# Patient Record
Sex: Female | Born: 1940 | Race: Black or African American | Hispanic: No | Marital: Married | Smoking: Former smoker
Health system: Southern US, Community
[De-identification: ages and names within clinical notes are randomized; demographics above are authoritative.]

## PROBLEM LIST (undated history)

## (undated) DIAGNOSIS — R319 Hematuria, unspecified: Secondary | ICD-10-CM

## (undated) DIAGNOSIS — F32A Depression, unspecified: Secondary | ICD-10-CM

## (undated) DIAGNOSIS — K8689 Other specified diseases of pancreas: Secondary | ICD-10-CM

## (undated) DIAGNOSIS — G309 Alzheimer's disease, unspecified: Secondary | ICD-10-CM

## (undated) DIAGNOSIS — F419 Anxiety disorder, unspecified: Secondary | ICD-10-CM

## (undated) DIAGNOSIS — F329 Major depressive disorder, single episode, unspecified: Secondary | ICD-10-CM

## (undated) DIAGNOSIS — M353 Polymyalgia rheumatica: Secondary | ICD-10-CM

## (undated) DIAGNOSIS — D649 Anemia, unspecified: Secondary | ICD-10-CM

## (undated) DIAGNOSIS — G459 Transient cerebral ischemic attack, unspecified: Secondary | ICD-10-CM

## (undated) DIAGNOSIS — M797 Fibromyalgia: Secondary | ICD-10-CM

## (undated) DIAGNOSIS — R5382 Chronic fatigue, unspecified: Secondary | ICD-10-CM

## (undated) DIAGNOSIS — G9332 Myalgic encephalomyelitis/chronic fatigue syndrome: Secondary | ICD-10-CM

## (undated) DIAGNOSIS — F0280 Dementia in other diseases classified elsewhere without behavioral disturbance: Secondary | ICD-10-CM

## (undated) DIAGNOSIS — N390 Urinary tract infection, site not specified: Secondary | ICD-10-CM

## (undated) DIAGNOSIS — K599 Functional intestinal disorder, unspecified: Secondary | ICD-10-CM

## (undated) HISTORY — DX: Polymyalgia rheumatica: M35.3

## (undated) HISTORY — PX: OTHER SURGICAL HISTORY: SHX169

## (undated) HISTORY — DX: Dementia in other diseases classified elsewhere, unspecified severity, without behavioral disturbance, psychotic disturbance, mood disturbance, and anxiety: F02.80

## (undated) HISTORY — DX: Other specified diseases of pancreas: K86.89

## (undated) HISTORY — DX: Anxiety disorder, unspecified: F41.9

## (undated) HISTORY — DX: Alzheimer's disease, unspecified: G30.9

## (undated) HISTORY — DX: Transient cerebral ischemic attack, unspecified: G45.9

## (undated) HISTORY — DX: Anemia, unspecified: D64.9

## (undated) HISTORY — DX: Myalgic encephalomyelitis/chronic fatigue syndrome: G93.32

## (undated) HISTORY — DX: Fibromyalgia: M79.7

## (undated) HISTORY — DX: Functional intestinal disorder, unspecified: K59.9

## (undated) HISTORY — DX: Chronic fatigue, unspecified: R53.82

## (undated) HISTORY — DX: Depression, unspecified: F32.A

## (undated) HISTORY — DX: Urinary tract infection, site not specified: N39.0

## (undated) HISTORY — DX: Hematuria, unspecified: R31.9

## (undated) HISTORY — DX: Major depressive disorder, single episode, unspecified: F32.9

---

## 1997-03-20 ENCOUNTER — Ambulatory Visit (HOSPITAL_COMMUNITY): Admission: RE | Admit: 1997-03-20 | Discharge: 1997-03-20 | Payer: Self-pay | Admitting: Internal Medicine

## 1997-06-03 ENCOUNTER — Other Ambulatory Visit: Admission: RE | Admit: 1997-06-03 | Discharge: 1997-06-03 | Payer: Self-pay | Admitting: Internal Medicine

## 1997-08-10 ENCOUNTER — Ambulatory Visit (HOSPITAL_COMMUNITY): Admission: RE | Admit: 1997-08-10 | Discharge: 1997-08-10 | Payer: Self-pay | Admitting: Internal Medicine

## 1997-10-15 ENCOUNTER — Ambulatory Visit (HOSPITAL_COMMUNITY): Admission: RE | Admit: 1997-10-15 | Discharge: 1997-10-15 | Payer: Self-pay | Admitting: Internal Medicine

## 1997-10-21 ENCOUNTER — Ambulatory Visit (HOSPITAL_COMMUNITY): Admission: RE | Admit: 1997-10-21 | Discharge: 1997-10-21 | Payer: Self-pay | Admitting: Internal Medicine

## 1997-10-21 ENCOUNTER — Encounter: Payer: Self-pay | Admitting: Internal Medicine

## 1998-03-17 ENCOUNTER — Emergency Department (HOSPITAL_COMMUNITY): Admission: EM | Admit: 1998-03-17 | Discharge: 1998-03-17 | Payer: Self-pay | Admitting: Emergency Medicine

## 1998-10-05 ENCOUNTER — Other Ambulatory Visit: Admission: RE | Admit: 1998-10-05 | Discharge: 1998-10-05 | Payer: Self-pay | Admitting: Internal Medicine

## 1998-10-20 ENCOUNTER — Ambulatory Visit (HOSPITAL_COMMUNITY): Admission: RE | Admit: 1998-10-20 | Discharge: 1998-10-20 | Payer: Self-pay | Admitting: Internal Medicine

## 1998-10-20 ENCOUNTER — Encounter: Payer: Self-pay | Admitting: Internal Medicine

## 1998-11-16 ENCOUNTER — Encounter: Admission: RE | Admit: 1998-11-16 | Discharge: 1998-11-16 | Payer: Self-pay | Admitting: Internal Medicine

## 1998-11-16 ENCOUNTER — Encounter: Payer: Self-pay | Admitting: Internal Medicine

## 2000-04-20 ENCOUNTER — Ambulatory Visit (HOSPITAL_COMMUNITY): Admission: RE | Admit: 2000-04-20 | Discharge: 2000-04-20 | Payer: Self-pay | Admitting: Neurology

## 2000-05-08 ENCOUNTER — Encounter: Admission: RE | Admit: 2000-05-08 | Discharge: 2000-05-08 | Payer: Self-pay | Admitting: Internal Medicine

## 2000-05-08 ENCOUNTER — Encounter: Payer: Self-pay | Admitting: Internal Medicine

## 2000-06-01 ENCOUNTER — Ambulatory Visit (HOSPITAL_COMMUNITY): Admission: RE | Admit: 2000-06-01 | Discharge: 2000-06-01 | Payer: Self-pay | Admitting: Neurology

## 2000-06-01 ENCOUNTER — Encounter: Payer: Self-pay | Admitting: Neurology

## 2001-02-27 ENCOUNTER — Ambulatory Visit (HOSPITAL_COMMUNITY): Admission: RE | Admit: 2001-02-27 | Discharge: 2001-02-27 | Payer: Self-pay | Admitting: Gastroenterology

## 2001-02-27 ENCOUNTER — Encounter (INDEPENDENT_AMBULATORY_CARE_PROVIDER_SITE_OTHER): Payer: Self-pay | Admitting: Specialist

## 2001-05-15 ENCOUNTER — Encounter: Payer: Self-pay | Admitting: Internal Medicine

## 2001-05-15 ENCOUNTER — Encounter: Admission: RE | Admit: 2001-05-15 | Discharge: 2001-05-15 | Payer: Self-pay | Admitting: Internal Medicine

## 2002-08-26 ENCOUNTER — Encounter: Payer: Self-pay | Admitting: Internal Medicine

## 2002-08-26 ENCOUNTER — Encounter: Admission: RE | Admit: 2002-08-26 | Discharge: 2002-08-26 | Payer: Self-pay | Admitting: Internal Medicine

## 2003-09-04 ENCOUNTER — Encounter: Admission: RE | Admit: 2003-09-04 | Discharge: 2003-09-04 | Payer: Self-pay | Admitting: Internal Medicine

## 2003-10-02 ENCOUNTER — Encounter (HOSPITAL_COMMUNITY): Admission: RE | Admit: 2003-10-02 | Discharge: 2003-12-31 | Payer: Self-pay | Admitting: Cardiology

## 2004-09-14 ENCOUNTER — Encounter: Admission: RE | Admit: 2004-09-14 | Discharge: 2004-09-14 | Payer: Self-pay | Admitting: Internal Medicine

## 2004-12-02 ENCOUNTER — Encounter (INDEPENDENT_AMBULATORY_CARE_PROVIDER_SITE_OTHER): Payer: Self-pay | Admitting: Specialist

## 2004-12-02 ENCOUNTER — Ambulatory Visit (HOSPITAL_COMMUNITY): Admission: RE | Admit: 2004-12-02 | Discharge: 2004-12-02 | Payer: Self-pay | Admitting: General Surgery

## 2005-09-02 ENCOUNTER — Emergency Department (HOSPITAL_COMMUNITY): Admission: EM | Admit: 2005-09-02 | Discharge: 2005-09-02 | Payer: Self-pay | Admitting: Emergency Medicine

## 2005-09-22 ENCOUNTER — Encounter: Admission: RE | Admit: 2005-09-22 | Discharge: 2005-09-22 | Payer: Self-pay | Admitting: Internal Medicine

## 2006-03-12 ENCOUNTER — Ambulatory Visit (HOSPITAL_BASED_OUTPATIENT_CLINIC_OR_DEPARTMENT_OTHER): Admission: RE | Admit: 2006-03-12 | Discharge: 2006-03-12 | Payer: Self-pay | Admitting: General Surgery

## 2006-03-12 ENCOUNTER — Encounter (INDEPENDENT_AMBULATORY_CARE_PROVIDER_SITE_OTHER): Payer: Self-pay | Admitting: *Deleted

## 2006-06-18 ENCOUNTER — Ambulatory Visit: Payer: Self-pay | Admitting: Psychology

## 2006-09-24 ENCOUNTER — Encounter: Admission: RE | Admit: 2006-09-24 | Discharge: 2006-09-24 | Payer: Self-pay | Admitting: Internal Medicine

## 2007-02-15 ENCOUNTER — Encounter: Admission: RE | Admit: 2007-02-15 | Discharge: 2007-02-15 | Payer: Self-pay | Admitting: Internal Medicine

## 2007-08-20 ENCOUNTER — Emergency Department (HOSPITAL_COMMUNITY): Admission: EM | Admit: 2007-08-20 | Discharge: 2007-08-20 | Payer: Self-pay | Admitting: Emergency Medicine

## 2007-08-28 ENCOUNTER — Encounter: Admission: RE | Admit: 2007-08-28 | Discharge: 2007-08-28 | Payer: Self-pay | Admitting: Neurology

## 2007-09-25 ENCOUNTER — Encounter: Admission: RE | Admit: 2007-09-25 | Discharge: 2007-09-25 | Payer: Self-pay | Admitting: Internal Medicine

## 2007-12-30 ENCOUNTER — Encounter: Admission: RE | Admit: 2007-12-30 | Discharge: 2007-12-30 | Payer: Self-pay | Admitting: Internal Medicine

## 2008-05-04 ENCOUNTER — Emergency Department (HOSPITAL_COMMUNITY): Admission: EM | Admit: 2008-05-04 | Discharge: 2008-05-04 | Payer: Self-pay | Admitting: Emergency Medicine

## 2008-12-17 ENCOUNTER — Inpatient Hospital Stay (HOSPITAL_COMMUNITY): Admission: AD | Admit: 2008-12-17 | Discharge: 2008-12-23 | Payer: Self-pay | Admitting: Internal Medicine

## 2009-01-09 ENCOUNTER — Emergency Department (HOSPITAL_COMMUNITY): Admission: EM | Admit: 2009-01-09 | Discharge: 2009-01-10 | Payer: Self-pay | Admitting: Emergency Medicine

## 2009-01-11 ENCOUNTER — Inpatient Hospital Stay (HOSPITAL_COMMUNITY): Admission: AD | Admit: 2009-01-11 | Discharge: 2009-01-19 | Payer: Self-pay | Admitting: Internal Medicine

## 2009-01-29 ENCOUNTER — Encounter: Admission: RE | Admit: 2009-01-29 | Discharge: 2009-01-29 | Payer: Self-pay | Admitting: Internal Medicine

## 2009-06-24 LAB — HM COLONOSCOPY

## 2009-09-22 ENCOUNTER — Ambulatory Visit (HOSPITAL_COMMUNITY): Admission: RE | Admit: 2009-09-22 | Discharge: 2009-09-22 | Payer: Self-pay | Admitting: Orthopedic Surgery

## 2010-03-17 LAB — BASIC METABOLIC PANEL
CO2: 26 mEq/L (ref 19–32)
Chloride: 109 mEq/L (ref 96–112)
Creatinine, Ser: 0.71 mg/dL (ref 0.4–1.2)
GFR calc Af Amer: >60 mL/min (ref >60)

## 2010-03-17 LAB — CBC
MCH: 30.9 pg (ref 26.0–34.0)
MCV: 92.4 fL (ref 78.0–100.0)
Platelets: 111 10*3/uL — ABNORMAL LOW (ref 150–400)
RBC: 3.53 MIL/uL — ABNORMAL LOW (ref 3.87–5.11)

## 2010-03-20 LAB — COMPREHENSIVE METABOLIC PANEL
ALT: 27 U/L (ref 0–35)
ALT: 34 U/L (ref 0–35)
AST: 25 U/L (ref 0–37)
AST: 33 U/L (ref 0–37)
Albumin: 2.9 g/dL — ABNORMAL LOW (ref 3.5–5.2)
Albumin: 3.3 g/dL — ABNORMAL LOW (ref 3.5–5.2)
Albumin: 3.8 g/dL (ref 3.5–5.2)
Alkaline Phosphatase: 36 U/L — ABNORMAL LOW (ref 39–117)
Alkaline Phosphatase: 37 U/L — ABNORMAL LOW (ref 39–117)
Alkaline Phosphatase: 38 U/L — ABNORMAL LOW (ref 39–117)
Alkaline Phosphatase: 41 U/L (ref 39–117)
Alkaline Phosphatase: 42 U/L (ref 39–117)
BUN: 14 mg/dL (ref 6–23)
BUN: 15 mg/dL (ref 6–23)
BUN: 17 mg/dL (ref 6–23)
CO2: 28 mEq/L (ref 19–32)
CO2: 29 mEq/L (ref 19–32)
Calcium: 8.7 mg/dL (ref 8.4–10.5)
Calcium: 8.8 mg/dL (ref 8.4–10.5)
Chloride: 104 mEq/L (ref 96–112)
Chloride: 105 mEq/L (ref 96–112)
Chloride: 105 mEq/L (ref 96–112)
Chloride: 105 mEq/L (ref 96–112)
Creatinine, Ser: 0.62 mg/dL (ref 0.4–1.2)
Creatinine, Ser: 0.71 mg/dL (ref 0.4–1.2)
Creatinine, Ser: 0.73 mg/dL (ref 0.4–1.2)
GFR calc Af Amer: >60 mL/min (ref >60)
GFR calc Af Amer: >60 mL/min (ref >60)
GFR calc non Af Amer: >60 mL/min (ref >60)
GFR calc non Af Amer: >60 mL/min (ref >60)
Glucose, Bld: 109 mg/dL — ABNORMAL HIGH (ref 70–99)
Glucose, Bld: 115 mg/dL — ABNORMAL HIGH (ref 70–99)
Glucose, Bld: 140 mg/dL — ABNORMAL HIGH (ref 70–99)
Glucose, Bld: 146 mg/dL — ABNORMAL HIGH (ref 70–99)
Potassium: 3.7 mEq/L (ref 3.5–5.1)
Potassium: 3.9 mEq/L (ref 3.5–5.1)
Potassium: 4.2 mEq/L (ref 3.5–5.1)
Potassium: 4.2 mEq/L (ref 3.5–5.1)
Sodium: 139 mEq/L (ref 135–145)
Sodium: 140 mEq/L (ref 135–145)
Sodium: 141 mEq/L (ref 135–145)
Sodium: 141 mEq/L (ref 135–145)
Total Bilirubin: 0.3 mg/dL (ref 0.3–1.2)
Total Bilirubin: 0.5 mg/dL (ref 0.3–1.2)
Total Bilirubin: 0.6 mg/dL (ref 0.3–1.2)
Total Bilirubin: 0.7 mg/dL (ref 0.3–1.2)
Total Protein: 5.7 g/dL — ABNORMAL LOW (ref 6.0–8.3)
Total Protein: 6 g/dL (ref 6.0–8.3)
Total Protein: 6.9 g/dL (ref 6.0–8.3)

## 2010-03-20 LAB — CBC
HCT: 32.8 % — ABNORMAL LOW (ref 36.0–46.0)
HCT: 34.7 % — ABNORMAL LOW (ref 36.0–46.0)
Hemoglobin: 11 g/dL — ABNORMAL LOW (ref 12.0–15.0)
Hemoglobin: 11.5 g/dL — ABNORMAL LOW (ref 12.0–15.0)
MCHC: 33.7 g/dL (ref 30.0–36.0)
MCV: 93 fL (ref 78.0–100.0)
Platelets: 141 10*3/uL — ABNORMAL LOW (ref 150–400)
RBC: 3.52 MIL/uL — ABNORMAL LOW (ref 3.87–5.11)
RBC: 3.71 MIL/uL — ABNORMAL LOW (ref 3.87–5.11)
RDW: 13.1 % (ref 11.5–15.5)
RDW: 13.4 % (ref 11.5–15.5)
WBC: 16.2 K/uL — ABNORMAL HIGH (ref 4.0–10.5)

## 2010-03-20 LAB — URINALYSIS, ROUTINE W REFLEX MICROSCOPIC
Bilirubin Urine: NEGATIVE
Nitrite: NEGATIVE
Specific Gravity, Urine: 1.024 (ref 1.005–1.030)
pH: 5.5 (ref 5.0–8.0)

## 2010-03-20 LAB — URINE CULTURE: Colony Count: 60000

## 2010-03-20 LAB — AMYLASE
Amylase: 372 U/L — ABNORMAL HIGH (ref 0–105)
Amylase: 80 U/L (ref 0–105)
Amylase: 95 U/L (ref 0–105)

## 2010-03-20 LAB — COMPREHENSIVE METABOLIC PANEL WITH GFR
ALT: 31 U/L (ref 0–35)
BUN: 25 mg/dL — ABNORMAL HIGH (ref 6–23)
CO2: 27 meq/L (ref 19–32)
Calcium: 9.7 mg/dL (ref 8.4–10.5)
Creatinine, Ser: 0.74 mg/dL (ref 0.4–1.2)
GFR calc non Af Amer: >60 mL/min (ref >60)
Glucose, Bld: 200 mg/dL — ABNORMAL HIGH (ref 70–99)
Total Protein: 7.1 g/dL (ref 6.0–8.3)

## 2010-03-20 LAB — DIFFERENTIAL
Basophils Absolute: 0 K/uL (ref 0.0–0.1)
Basophils Relative: 0 % (ref 0–1)
Eosinophils Absolute: 0 10*3/uL (ref 0.0–0.7)
Eosinophils Relative: 0 % (ref 0–5)
Lymphocytes Relative: 3 % — ABNORMAL LOW (ref 12–46)
Lymphs Abs: 0.5 K/uL — ABNORMAL LOW (ref 0.7–4.0)
Monocytes Absolute: 1 K/uL (ref 0.1–1.0)
Monocytes Relative: 6 % (ref 3–12)
Neutro Abs: 14.7 K/uL — ABNORMAL HIGH (ref 1.7–7.7)
Neutrophils Relative %: 91 % — ABNORMAL HIGH (ref 43–77)

## 2010-03-20 LAB — POCT CARDIAC MARKERS
CKMB, poc: <1 ng/mL — ABNORMAL LOW (ref 1.0–8.0)
Myoglobin, poc: 40 ng/mL (ref 12–200)
Troponin i, poc: <0.05 ng/mL (ref 0.00–0.09)

## 2010-03-20 LAB — URINE MICROSCOPIC-ADD ON

## 2010-03-20 LAB — AMMONIA: Ammonia: 15 umol/L (ref 11–35)

## 2010-03-20 LAB — GLUCOSE, CAPILLARY
Glucose-Capillary: 162 mg/dL — ABNORMAL HIGH (ref 70–99)
Glucose-Capillary: 211 mg/dL — ABNORMAL HIGH (ref 70–99)

## 2010-03-20 LAB — LIPASE, BLOOD
Lipase: 29 U/L (ref 11–59)
Lipase: 32 U/L (ref 11–59)
Lipase: 331 U/L — ABNORMAL HIGH (ref 11–59)

## 2010-03-21 LAB — COMPREHENSIVE METABOLIC PANEL
AST: 64 U/L — ABNORMAL HIGH (ref 0–37)
Albumin: 3.3 g/dL — ABNORMAL LOW (ref 3.5–5.2)
Alkaline Phosphatase: 37 U/L — ABNORMAL LOW (ref 39–117)
Chloride: 105 mEq/L (ref 96–112)
GFR calc Af Amer: >60 mL/min (ref >60)
Potassium: 4.2 mEq/L (ref 3.5–5.1)
Total Bilirubin: 0.3 mg/dL (ref 0.3–1.2)

## 2010-03-21 LAB — GLUCOSE, CAPILLARY

## 2010-04-04 LAB — GLUCOSE, CAPILLARY
Glucose-Capillary: 122 mg/dL — ABNORMAL HIGH (ref 70–99)
Glucose-Capillary: 142 mg/dL — ABNORMAL HIGH (ref 70–99)
Glucose-Capillary: 150 mg/dL — ABNORMAL HIGH (ref 70–99)
Glucose-Capillary: 156 mg/dL — ABNORMAL HIGH (ref 70–99)
Glucose-Capillary: 183 mg/dL — ABNORMAL HIGH (ref 70–99)
Glucose-Capillary: 196 mg/dL — ABNORMAL HIGH (ref 70–99)
Glucose-Capillary: 197 mg/dL — ABNORMAL HIGH (ref 70–99)
Glucose-Capillary: 265 mg/dL — ABNORMAL HIGH (ref 70–99)
Glucose-Capillary: 71 mg/dL (ref 70–99)
Glucose-Capillary: 73 mg/dL (ref 70–99)
Glucose-Capillary: 91 mg/dL (ref 70–99)
Glucose-Capillary: 93 mg/dL (ref 70–99)
Glucose-Capillary: 94 mg/dL (ref 70–99)
Glucose-Capillary: 96 mg/dL (ref 70–99)

## 2010-04-04 LAB — CBC
MCHC: 33.5 g/dL (ref 30.0–36.0)
MCV: 93.6 fL (ref 78.0–100.0)
MCV: 94.6 fL (ref 78.0–100.0)
Platelets: 152 10*3/uL (ref 150–400)
Platelets: 175 10*3/uL (ref 150–400)
RBC: 3.35 MIL/uL — ABNORMAL LOW (ref 3.87–5.11)
RBC: 3.71 MIL/uL — ABNORMAL LOW (ref 3.87–5.11)
WBC: 9 10*3/uL (ref 4.0–10.5)

## 2010-04-04 LAB — COMPREHENSIVE METABOLIC PANEL
ALT: 16 U/L (ref 0–35)
ALT: 34 U/L (ref 0–35)
AST: 21 U/L (ref 0–37)
AST: 31 U/L (ref 0–37)
Albumin: 3.1 g/dL — ABNORMAL LOW (ref 3.5–5.2)
Albumin: 3.8 g/dL (ref 3.5–5.2)
Alkaline Phosphatase: 38 U/L — ABNORMAL LOW (ref 39–117)
BUN: 23 mg/dL (ref 6–23)
CO2: 26 mEq/L (ref 19–32)
CO2: 29 mEq/L (ref 19–32)
Chloride: 104 mEq/L (ref 96–112)
Chloride: 106 mEq/L (ref 96–112)
Creatinine, Ser: 0.68 mg/dL (ref 0.4–1.2)
Creatinine, Ser: 0.86 mg/dL (ref 0.4–1.2)
GFR calc Af Amer: >60 mL/min (ref >60)
GFR calc Af Amer: >60 mL/min (ref >60)
GFR calc non Af Amer: >60 mL/min (ref >60)
Glucose, Bld: 121 mg/dL — ABNORMAL HIGH (ref 70–99)
Glucose, Bld: 157 mg/dL — ABNORMAL HIGH (ref 70–99)
Potassium: 3.8 mEq/L (ref 3.5–5.1)
Sodium: 137 mEq/L (ref 135–145)
Sodium: 139 mEq/L (ref 135–145)
Total Bilirubin: 0.3 mg/dL (ref 0.3–1.2)
Total Bilirubin: 0.6 mg/dL (ref 0.3–1.2)
Total Protein: 5.6 g/dL — ABNORMAL LOW (ref 6.0–8.3)

## 2010-04-04 LAB — DIFFERENTIAL
Basophils Absolute: 0 10*3/uL (ref 0.0–0.1)
Lymphocytes Relative: 10 % — ABNORMAL LOW (ref 12–46)
Monocytes Absolute: 0.2 10*3/uL (ref 0.1–1.0)
Neutro Abs: 6.9 10*3/uL (ref 1.7–7.7)
Neutrophils Relative %: 87 % — ABNORMAL HIGH (ref 43–77)

## 2010-04-04 LAB — CEA: CEA: 1 ng/mL (ref 0.0–5.0)

## 2010-04-04 LAB — SEDIMENTATION RATE: Sed Rate: 41 mm/hr — ABNORMAL HIGH (ref 0–22)

## 2010-04-04 LAB — URINALYSIS, ROUTINE W REFLEX MICROSCOPIC
Glucose, UA: NEGATIVE mg/dL
Ketones, ur: NEGATIVE mg/dL
Protein, ur: NEGATIVE mg/dL

## 2010-04-04 LAB — C-REACTIVE PROTEIN: CRP: 0 mg/dL — ABNORMAL LOW (ref <0.6)

## 2010-04-04 LAB — PREALBUMIN: Prealbumin: 44 mg/dL (ref 18.0–45.0)

## 2010-04-04 LAB — URINE CULTURE
Culture: NO GROWTH
Special Requests: NEGATIVE

## 2010-04-04 LAB — CANCER ANTIGEN 19-9: CA 19-9: 284.7 U/mL — ABNORMAL HIGH (ref <35.0)

## 2010-04-07 ENCOUNTER — Other Ambulatory Visit: Payer: Self-pay | Admitting: Internal Medicine

## 2010-04-07 DIAGNOSIS — Z1231 Encounter for screening mammogram for malignant neoplasm of breast: Secondary | ICD-10-CM

## 2010-04-11 ENCOUNTER — Ambulatory Visit: Payer: Self-pay

## 2010-04-12 ENCOUNTER — Other Ambulatory Visit: Payer: Self-pay | Admitting: Internal Medicine

## 2010-04-12 ENCOUNTER — Ambulatory Visit
Admission: RE | Admit: 2010-04-12 | Discharge: 2010-04-12 | Disposition: A | Discharge status: Home / Self Care | Payer: Medicare Other | Source: Ambulatory Visit | Attending: Internal Medicine | Admitting: Internal Medicine

## 2010-04-12 DIAGNOSIS — Z1231 Encounter for screening mammogram for malignant neoplasm of breast: Secondary | ICD-10-CM

## 2010-04-12 LAB — CBC
HCT: 36.3 % (ref 36.0–46.0)
Hemoglobin: 12.2 g/dL (ref 12.0–15.0)
MCHC: 33.6 g/dL (ref 30.0–36.0)
MCV: 93.9 fL (ref 78.0–100.0)
RDW: 13.3 % (ref 11.5–15.5)

## 2010-04-12 LAB — URINALYSIS, ROUTINE W REFLEX MICROSCOPIC
Bilirubin Urine: NEGATIVE
Ketones, ur: 15 mg/dL — AB
Nitrite: NEGATIVE
Protein, ur: NEGATIVE mg/dL
pH: 5.5 (ref 5.0–8.0)

## 2010-04-12 LAB — URINE MICROSCOPIC-ADD ON

## 2010-04-12 LAB — DIFFERENTIAL
Basophils Absolute: 0 10*3/uL (ref 0.0–0.1)
Eosinophils Absolute: 0 10*3/uL (ref 0.0–0.7)
Eosinophils Relative: 0 % (ref 0–5)
Lymphocytes Relative: 8 % — ABNORMAL LOW (ref 12–46)
Monocytes Absolute: 0.2 10*3/uL (ref 0.1–1.0)

## 2010-04-12 LAB — POCT I-STAT, CHEM 8
Chloride: 106 mEq/L (ref 96–112)
Glucose, Bld: 188 mg/dL — ABNORMAL HIGH (ref 70–99)
HCT: 37 % (ref 36.0–46.0)
Potassium: 3.9 mEq/L (ref 3.5–5.1)

## 2010-04-12 LAB — HM MAMMOGRAPHY

## 2010-04-12 LAB — GLUCOSE, CAPILLARY: Glucose-Capillary: 190 mg/dL — ABNORMAL HIGH (ref 70–99)

## 2010-04-15 ENCOUNTER — Encounter: Payer: Self-pay | Admitting: Internal Medicine

## 2010-04-26 ENCOUNTER — Other Ambulatory Visit: Payer: Self-pay | Admitting: Internal Medicine

## 2010-04-26 ENCOUNTER — Ambulatory Visit
Admission: RE | Admit: 2010-04-26 | Discharge: 2010-04-26 | Disposition: A | Discharge status: Home / Self Care | Payer: Medicare Other | Source: Ambulatory Visit | Attending: Internal Medicine | Admitting: Internal Medicine

## 2010-05-20 NOTE — Procedures (Signed)
[facility]  Patient:    Jaclyn Hernandez, Jaclyn Hernandez Visit Number: [id number] MRN: [id number]          Service Type: END Location: ENDO Attending Physician:  Rich Brave Dictated by:   Florencia Reasons, M.D. Proc. Date: 02/27/01 Admit Date:  02/27/2001   CC:         Eric L. August Saucer, M.D.   Procedure Report  PROCEDURE:  Colonoscopy with biopsies.  INDICATIONS FOR PROCEDURE:  A 70 year old female with chronic intermittent diarrhea (doing better recently) and need for colon cancer screening.  FINDINGS:  Normal exam to the cecum.  DESCRIPTION OF PROCEDURE:  The nature, purpose and risk of the procedure had been discussed with the patient who provided written consent. Sedation was fentanyl 50 mcg and Versed 6 mg IV without arrhythmias or desaturation. The Olympus pediatric standard extended length video colonoscope was advanced to the cecum with a little bit of external abdominal compression to help control looping and with slight difficulty getting around some angulated turns in the sigmoid region. The cecum was identified by visualization of the appendiceal orifice, the ileocecal valve and transillumination of the right lower quadrant. Pullback was then performed. The quality of the prep was excellent and it was felt that all areas were well seen.  This was a completely normal exam. No polyps, cancer, colitis, vascular malformations or diverticulosis were noted. Retroflexion was not accomplished in the rectum due to the anatomy but antegrade viewing disclosed no distal rectal lesions. Random mucosal biopsies were obtained along the length of the colon.  The patient tolerated the procedure well and there were no apparent complications.  IMPRESSION:   Normal colonoscopy. No endoscopically-evident source of diarrhea identified.  PLAN:  Await pathology on biopsies. Consider screening sigmoidoscopy in five years. Dictated by:   Florencia Reasons,  M.D. Attending Physician:  Rich Brave DD:  02/27/01 TD:  02/27/01 Job: [id number]

## 2010-05-20 NOTE — Op Note (Signed)
[name], [name]               ACCOUNT NO.:  [id number]   MEDICAL RECORD NO.:  [id number]          PATIENT TYPE:  AMB   LOCATION:  SDS                          FACILITY:  [facility]   PHYSICIAN:  Leonie Man, M.D.   DATE OF BIRTH:  Aug 04, 1940   DATE OF PROCEDURE:  12/02/2004  DATE OF DISCHARGE:                                 OPERATIVE REPORT   PREOP DIAGNOSIS:  Follicular cysts left labia majora   POSTOPERATIVE DIAGNOSIS:  Follicular cysts left labia majora, pathology  pending.   PROCEDURE:  Excision lesion left labia majora under local anesthesia.   NOTE:  [name] is a 70 year old lady with a labial cyst which she wishes  to have removed. She comes to the operating room, now, after the risks and  potential benefits of surgery have been discussed. All questions answered  and consent obtained.   With the patient positioned supinely and the left leg extended laterally.  The region of the cyst over the labia majora on the left was then prepped  and then draped into a sterile operative field.  The region around the cyst  is infiltrated with 1% lidocaine with epinephrine. An elliptical incision is  made around the cyst deepened through skin and subcutaneous tissues to  excise the entire skin and lesion. This was removed in its entirety and  forwarded for pathologic evaluation. Hemostasis was secured with some mild  pressure on the incision; and then the incision was closed in two layers  using interrupted 3-0 Vicryl sutures in the deep layers; and a running 4-0  Vicryl suture placed subcuticularly. The wound was then reinforced with  Steri-Strips and the patient removed from the operating room to the recovery  room in stable condition. She tolerated this procedure well.      Leonie Man, M.D.  Electronically Signed     PB/MEDQ  D:  12/02/2004  T:  12/02/2004  Job:  [id number]

## 2010-05-20 NOTE — Op Note (Signed)
[place]  Patient:    Jaclyn Hernandez, Jaclyn Hernandez                        MRN: [id number] Proc. Date: 04/20/00 Adm. Date:  [id number] Attending:  Erich Montane                           Operative Report  INDICATIONS:  The patient has a history of memory loss.  PROCEDURE: The patient was prepped and draped in the left lateral decubitus position.  The L3-L4 interspace was entered without difficulty.  The opening pressure was 155 mm of water.  Clear, colorless CSF was obtained, approximately 11 cc, and sent for studies including VDRL, cryptococcal antigens, IgG, oligoclonal IgG, protein, glucose, cell count, differential, ______ beta amyloid 42 protein.  The patient tolerated the procedure well. DD:  04/20/00 TD:  04/21/00 Job: [id number]

## 2010-07-11 ENCOUNTER — Emergency Department (HOSPITAL_COMMUNITY)
Admission: EM | Admit: 2010-07-11 | Discharge: 2010-07-11 | Disposition: A | Discharge status: Home / Self Care | Payer: Medicare Other | Attending: Emergency Medicine | Admitting: Emergency Medicine

## 2010-07-11 DIAGNOSIS — R5381 Other malaise: Secondary | ICD-10-CM | POA: Insufficient documentation

## 2010-07-11 DIAGNOSIS — R42 Dizziness and giddiness: Secondary | ICD-10-CM | POA: Insufficient documentation

## 2010-07-11 DIAGNOSIS — K5289 Other specified noninfective gastroenteritis and colitis: Secondary | ICD-10-CM | POA: Insufficient documentation

## 2010-07-11 DIAGNOSIS — E119 Type 2 diabetes mellitus without complications: Secondary | ICD-10-CM | POA: Insufficient documentation

## 2010-07-11 LAB — COMPREHENSIVE METABOLIC PANEL
AST: 23 U/L (ref 0–37)
BUN: 21 mg/dL (ref 6–23)
CO2: 28 mEq/L (ref 19–32)
Chloride: 100 mEq/L (ref 96–112)
Creatinine, Ser: 0.72 mg/dL (ref 0.50–1.10)
GFR calc non Af Amer: >60 mL/min (ref >60)
Glucose, Bld: 140 mg/dL — ABNORMAL HIGH (ref 70–99)
Total Bilirubin: 0.4 mg/dL (ref 0.3–1.2)

## 2010-07-11 LAB — CBC
HCT: 35.7 % — ABNORMAL LOW (ref 36.0–46.0)
Hemoglobin: 11.7 g/dL — ABNORMAL LOW (ref 12.0–15.0)
MCV: 92.7 fL (ref 78.0–100.0)
RBC: 3.85 MIL/uL — ABNORMAL LOW (ref 3.87–5.11)
WBC: 9.1 10*3/uL (ref 4.0–10.5)

## 2010-07-11 LAB — DIFFERENTIAL
Lymphocytes Relative: 7 % — ABNORMAL LOW (ref 12–46)
Lymphs Abs: 0.6 10*3/uL — ABNORMAL LOW (ref 0.7–4.0)
Neutrophils Relative %: 91 % — ABNORMAL HIGH (ref 43–77)

## 2011-01-06 ENCOUNTER — Other Ambulatory Visit: Payer: Self-pay | Admitting: Internal Medicine

## 2011-01-06 ENCOUNTER — Ambulatory Visit
Admission: RE | Admit: 2011-01-06 | Discharge: 2011-01-06 | Disposition: A | Discharge status: Home / Self Care | Payer: Medicare Other | Source: Ambulatory Visit | Attending: Internal Medicine | Admitting: Internal Medicine

## 2011-01-06 DIAGNOSIS — M25511 Pain in right shoulder: Secondary | ICD-10-CM

## 2011-07-31 ENCOUNTER — Other Ambulatory Visit: Payer: Self-pay | Admitting: Internal Medicine

## 2011-07-31 ENCOUNTER — Ambulatory Visit
Admission: RE | Admit: 2011-07-31 | Discharge: 2011-07-31 | Disposition: A | Discharge status: Home / Self Care | Payer: Medicare Other | Source: Ambulatory Visit | Attending: Internal Medicine | Admitting: Internal Medicine

## 2011-07-31 DIAGNOSIS — R109 Unspecified abdominal pain: Secondary | ICD-10-CM

## 2011-07-31 DIAGNOSIS — R112 Nausea with vomiting, unspecified: Secondary | ICD-10-CM

## 2011-09-05 ENCOUNTER — Other Ambulatory Visit: Payer: Self-pay | Admitting: Internal Medicine

## 2011-12-18 LAB — CBC AND DIFFERENTIAL
HCT: 36 % (ref 36–46)
Hemoglobin: 11.6 g/dL — AB (ref 12.0–16.0)

## 2011-12-29 IMAGING — CR DG ABDOMEN 2V
2 series · 2 of 2 positions shown · non-contrast
Comparison: CT abdomen and pelvis of 01/09/2009

CLINICAL DATA: Abdominal pain, nausea and vomiting

ABDOMEN - 2 VIEW

[view not recorded (1 of 2)]
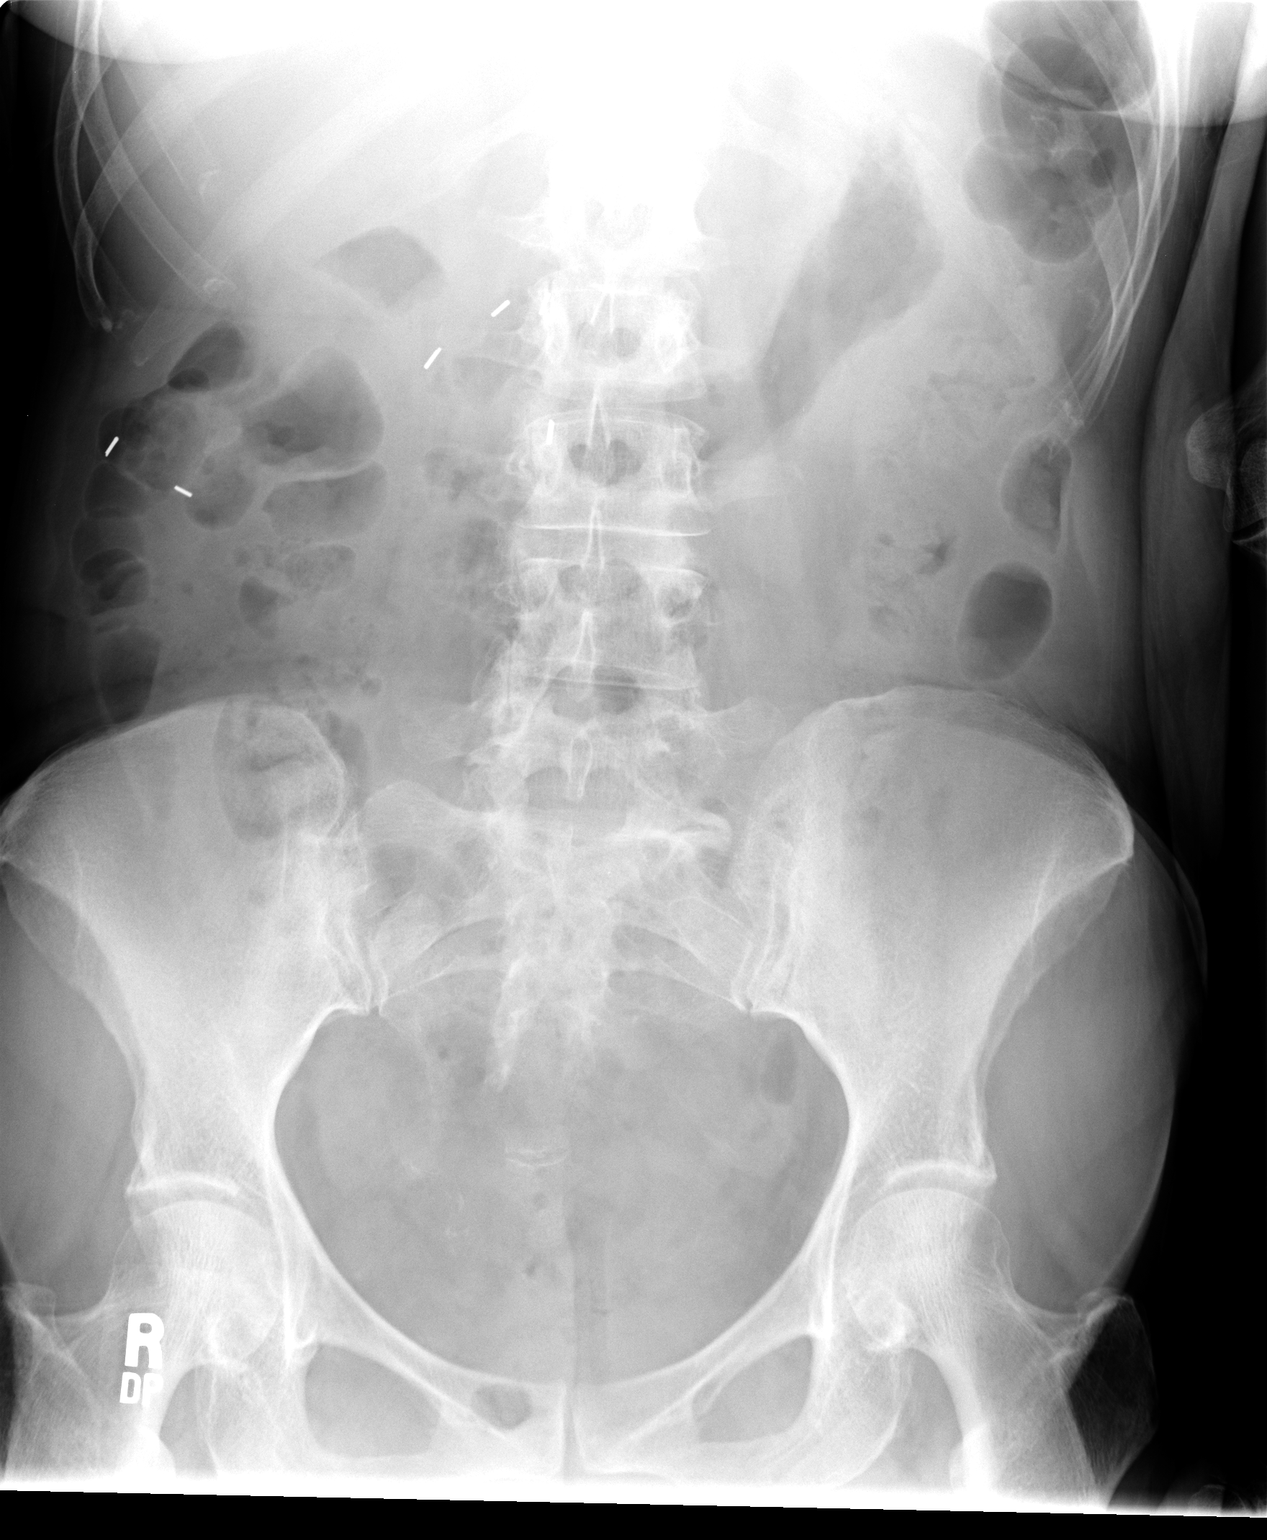

[view not recorded (2 of 2)]
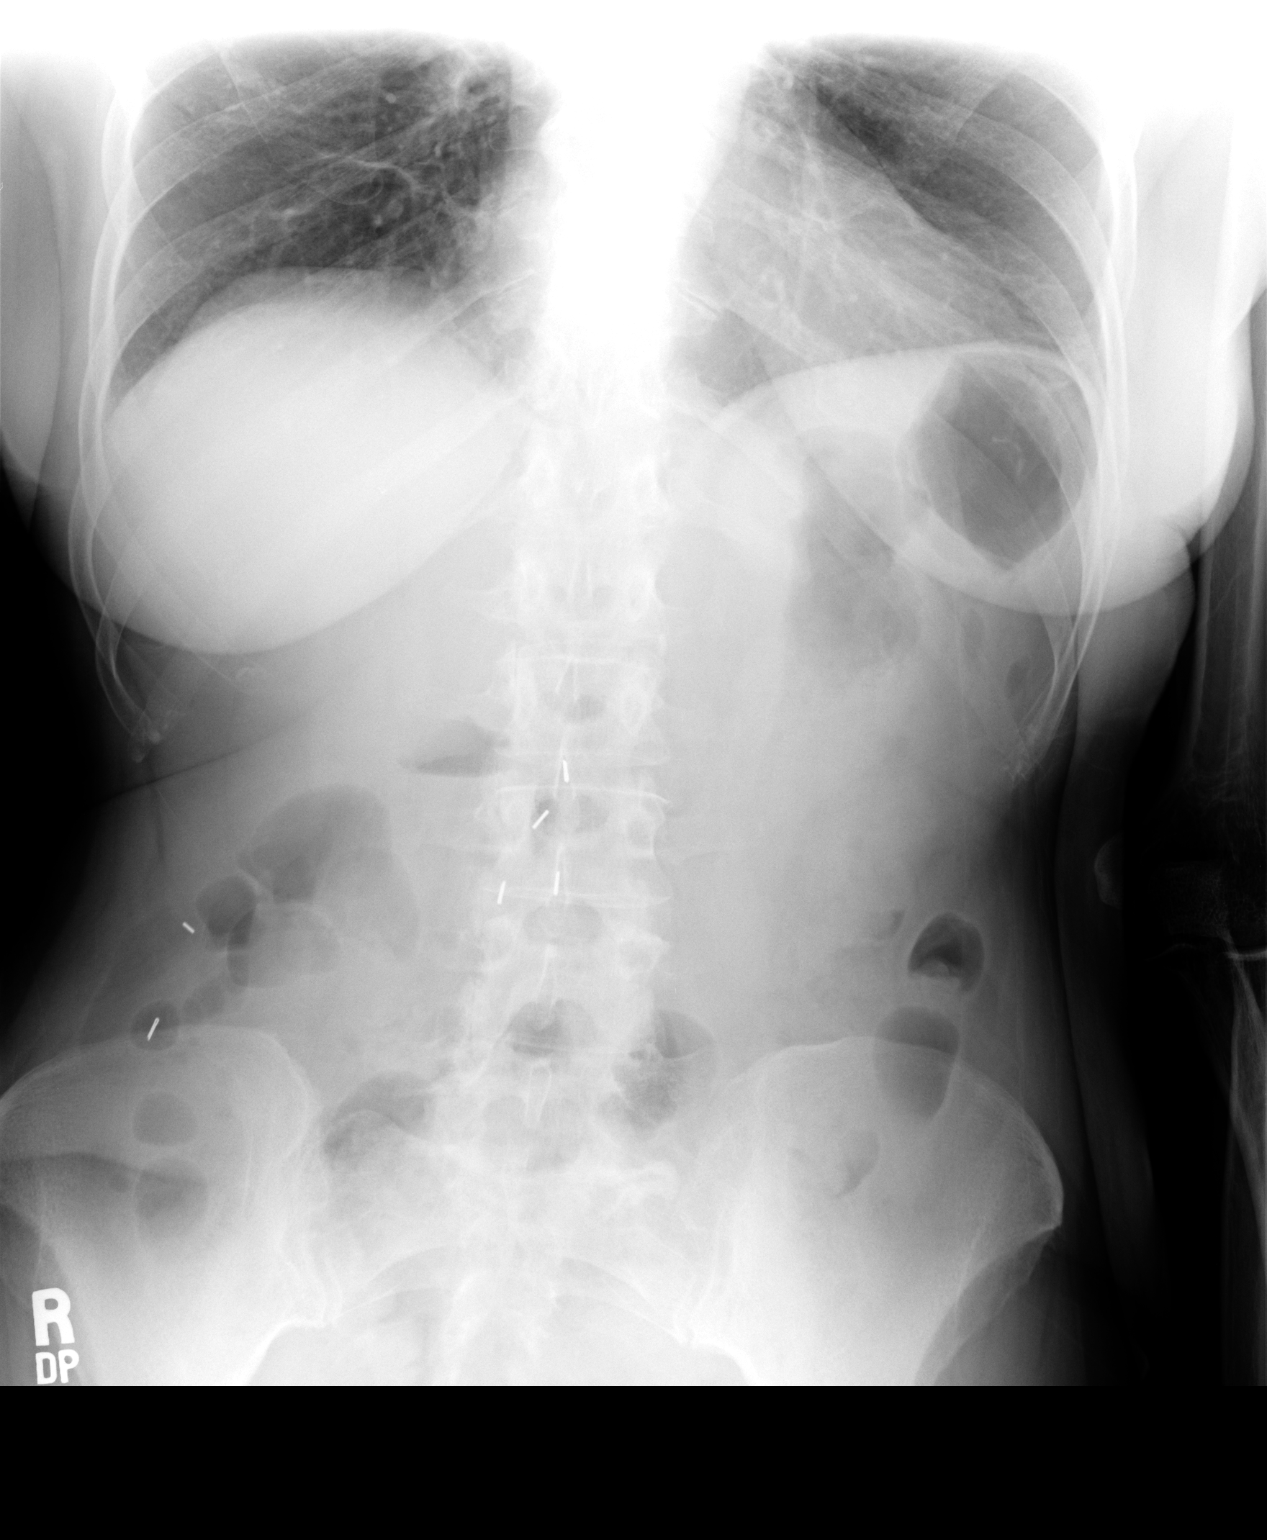

[2 of 2 positions shown; findings below may reference images not displayed]

FINDINGS: Supine and erect views of the abdomen show no bowel
obstruction.  No free air is seen.  Surgical clips are present in
the right abdomen.  No opaque calculi are noted.
IMPRESSION: No bowel obstruction.  No free air.

## 2012-02-08 LAB — HEPATIC FUNCTION PANEL: Bilirubin, Total: 0.5 mg/dL

## 2012-02-08 LAB — BASIC METABOLIC PANEL
BUN: 22 mg/dL — AB (ref 4–21)
Sodium: 139 mmol/L (ref 137–147)

## 2012-02-08 LAB — LIPID PANEL
LDL Cholesterol: 124 mg/dL
Triglycerides: 317 mg/dL — AB (ref 40–160)

## 2012-05-10 ENCOUNTER — Encounter: Payer: Self-pay | Admitting: Internal Medicine

## 2012-08-30 ENCOUNTER — Telehealth: Payer: Self-pay | Admitting: *Deleted

## 2012-08-30 NOTE — Telephone Encounter (Signed)
Patients spouse called because his wife is in need of a transfer of Care. She was a Dr.Love patient. He is also calling because his wife was on Namenda and they received message that Candiss Norse is no longer being manufactored so he is concerned as to what his wife will need to take as a result of this. Please let us know who to schedule her with. Mr. Ladouceur can be reached @ [phone] or [phone]

## 2012-09-03 NOTE — Telephone Encounter (Signed)
Assigned to Dr. Penumalli. 

## 2012-10-22 ENCOUNTER — Telehealth: Payer: Self-pay | Admitting: Diagnostic Neuroimaging

## 2012-10-22 NOTE — Telephone Encounter (Signed)
Husband stated that wife was having  episodes(forgot where she was, asking to go home but she was home), she is better now, had a friend of a nurse to come over to calm her down.scheduled for a sooner appt. -10/22 @2 :30pm-confirmed

## 2012-10-23 ENCOUNTER — Encounter: Payer: Self-pay | Admitting: Diagnostic Neuroimaging

## 2012-10-23 ENCOUNTER — Ambulatory Visit (INDEPENDENT_AMBULATORY_CARE_PROVIDER_SITE_OTHER): Payer: Medicare Other | Admitting: Diagnostic Neuroimaging

## 2012-10-23 ENCOUNTER — Encounter (INDEPENDENT_AMBULATORY_CARE_PROVIDER_SITE_OTHER): Payer: Self-pay

## 2012-10-23 VITALS — BP 98/56 | HR 81 | Temp 97.9°F | Ht 63.0 in | Wt 118.0 lb

## 2012-10-23 DIAGNOSIS — F0391 Unspecified dementia with behavioral disturbance: Secondary | ICD-10-CM

## 2012-10-23 MED ORDER — RIVASTIGMINE TARTRATE 3 MG PO CAPS: 3.0000 mg | ORAL_CAPSULE | Freq: Every day | ORAL | Status: DC

## 2012-10-23 MED ORDER — CITALOPRAM HYDROBROMIDE 40 MG PO TABS: 40.0000 mg | ORAL_TABLET | Freq: Every day | ORAL | Status: DC

## 2012-10-23 NOTE — Progress Notes (Signed)
[facility]  PATIENT: Jaclyn Hernandez DOB: [birth year]  REFERRING CLINICIAN:  HISTORY FROM: patient and husband REASON FOR VISIT: follow up   HISTORICAL  CHIEF COMPLAINT:  Chief Complaint  Patient presents with  . Follow-up    memory loss,  Rm 6    HISTORY OF PRESENT ILLNESS:   UPDATE 10/23/12: Since last visit, memory continues to decline. Over past few days, has had several episodes of waking up in the middle the night and feeling like she is not in her own home. She would tell her husband "let's go home". Husband with point at the photos on the walls and other objects in the home and reminded her that she is in her own home. She would accept this for several minutes and 10 minutes later, ask to "go home". This happened several times over the last couple of days. Patient has had episodes of feeling like she is living in the past (10-13 years ago). Patient has had some vivid dreams and hallucinations. She is not a danger to herself or to others at this time. She has not wandered out of the home. Patient continues to struggle with intermittent diarrhea every one to 2 weeks. This may have slightly improved since slight reduction in Exelon dose from one year ago. Depression seems to be getting worse and patient has tendency to cry spontaneously without any specific cause.  PRIOR HPI (10/03/11, Dr. Sandria Manly): 72 year old right-handed African American  married female with a positive family history of Alzheimer's disease and personal history of progressive memory loss that I have followed since 1994. 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 CSF evaluation has been positive for Alzheimer's disease and also  positive for oligoclonal banding 04/20/2000. Positive CSF oligoclonal banding can be seen with dementias. EEG 03/28/2000 was normal.  Neuropsychological testing 06/2006 showing numerous difficulties below expectations compared to her level of  functioning with diifficulty manipulating information in  temporary visual memory and learning and retaining information. She had side effects from Aricept and Razadyne and is currently on Exelon 3 mg tablets,2 in the morning and 1 at night. Higher doses cause diarrhea and she is currently having diarrhea. She is on Namenda 10 mg twice daily. She is independent in her activities of daily living. She is not driving . She doesn't pay the bills. She exercises by walking 60 minutes with her dogs 5 days a week. She shops with her husband. She notes progression of her memory loss. She denies depression. She denies daytime sleepiness, weight change, falls, or bowel or bladder incontinence.10/13/10= MMSE 17/30. Clock Drawing Task  1/4. Animal fluency test 8.Index for independence in activities of daily living 6. Golda Acre instrumental activities of daily living scale 3.Neuropsychiatric inventory=agitation 4, apathy 5, irritability 4. Neuropsychiatric evaluation last done 06/2006. Geriatric depression scale 0/15 Falls assessment tool score 7. 02/08/11 = MMSE 15/30. CDT 2/4. AFT 12.Katz Index of independence in activities of daily living 6 .Lawton-Brody Instrumental activities of daily living scale 3. Neuropsychiatric inventory=depression 6. Geriatric depression scale 1/15. Falls assessment tool score 7. she is having diarrhea but no muscle cramps. She enjoys social activities with her church. Recently they went to [facility] for a museum  trip. Her spirits are  good. Last blood studies were 2  weeks ago.10/1/2013MMSE 18/30.Clock drawing test 2/4. Animal fluency test 7. Myrtis Ser 6. Lawton-Brody 3.Neuropsychiatric inventory=0.  Geriatric depression scale 1/15 Falls assessment tool score  7.  REVIEW OF SYSTEMS: Full 14 system review of systems performed and notable only for memory loss confusion weakness anxiety too much sleep hallucination.  ALLERGIES: Allergies  Allergen Reactions  . Sulfa Antibiotics     HOME MEDICATIONS: Outpatient Prescriptions Prior to Visit  Medication Sig Dispense Refill  . MIRTAZAPINE PO Take 50 mg by mouth at bedtime.        . Multiple Vitamin (MULTIVITAMIN) tablet Take 1 tablet by mouth daily.        . Pancrelipase, Lip-Prot-Amyl, (CREON PO) Take by mouth.        Marland Kitchen POTASSIUM CHLORIDE PO Take by mouth daily.        Marland Kitchen PREDNISONE PO Take 10 mg by mouth daily.        . simvastatin (ZOCOR) 20 MG tablet Take 20 mg by mouth daily.        . sitaGLIPtan (JANUVIA) 100 MG tablet Take 100 mg by mouth daily.        . citalopram (CELEXA) 20 MG tablet Take 20 mg by mouth daily.        . rivastigmine (EXELON) 3 MG capsule Take 3 mg by mouth 2 (two) times daily.       . rivastigmine (EXELON) 6 MG capsule Take 6 mg by mouth every morning.         No facility-administered medications prior to visit.    PAST MEDICAL HISTORY: Past Medical History  Diagnosis Date  . Diabetes mellitus   . Alzheimer's disease   . Urinary tract infection, site not specified   . Hematuria, unspecified   . Chronic fatigue syndrome   . Alzheimer's dementia   . Colonic dysfunction   . Fibromyalgia   . Anemia   . Polymyalgia rheumatica   . Depression   . Anxiety   . Pancreatic insufficiency   . Unspecified transient cerebral ischemia     PAST SURGICAL HISTORY: Past Surgical History  Procedure Laterality Date  . None      FAMILY HISTORY: Family History  Problem Relation Age of Onset  . Cancer Mother   . Diabetes Father     SOCIAL HISTORY:  History   Social History  . Marital Status: Married    Spouse Name: Harlin    Number of Children: 2  . Years of Education: college   Occupational History  .      Minister   Social History Main Topics  . Smoking  status: Former Smoker    Quit date: 01/02/1982  . Smokeless tobacco: Not on file  . Alcohol Use: No     Comment: quit in 1982  . Drug Use: No  . Sexual Activity: Yes   Other Topics Concern  . Not on file   Social History Narrative   Patient lives at home with her spouse.   Caffeine Use:      PHYSICAL EXAM  Filed Vitals:   10/23/12 1455  BP: 98/56  Pulse: 81  Temp: 97.9 F (36.6 C)  TempSrc: Oral  Height: 5\' 3"  (1.6 m)  Weight: 118 lb (53.524 kg)    Not recorded    Body mass index is 20.91 kg/(m^2).  GENERAL EXAM: Patient is in no distress  CARDIOVASCULAR: Regular rate and rhythm, no murmurs, no carotid bruits  NEUROLOGIC: MENTAL STATUS: awake, alert, language fluent, comprehension intact, naming intact; POSITIVE  SNOUT. NEG MYERSONS. MMSE 13/30. AFT 3.  CRANIAL NERVE: no papilledema on fundoscopic exam, pupils equal and reactive to light, visual fields full to confrontation, extraocular muscles intact, no nystagmus, facial sensation and strength symmetric, uvula midline, shoulder shrug symmetric, tongue midline. MOTOR: normal bulk and tone, full strength in the BUE, BLE SENSORY: normal and symmetric to light touch COORDINATION: finger-nose-finger, fine finger movements normal REFLEXES: deep tendon reflexes present and symmetric GAIT/STATION: narrow based gait;    DIAGNOSTIC DATA (LABS, IMAGING, TESTING) - I reviewed patient records, labs, notes, testing and imaging myself where available.  Lab Results  Component Value Date   WBC 5.6 12/18/2011   HGB 11.6* 12/18/2011   HCT 36 12/18/2011   MCV 92.7 07/11/2010   PLT 60* 12/18/2011      Component Value Date/Time   NA 139 02/08/2012   NA 135 07/11/2010 2000   K 4.2 02/08/2012   CL 100 07/11/2010 2000   CO2 28 07/11/2010 2000   GLUCOSE 140* 07/11/2010 2000   BUN 22* 02/08/2012   BUN 21 07/11/2010 2000   CREATININE 1.0 02/08/2012   CREATININE 0.72 07/11/2010 2000   CALCIUM 10.0 07/11/2010 2000   PROT 7.5 07/11/2010 2000    ALBUMIN 4.1 07/11/2010 2000   AST 18 02/08/2012   ALT 22 02/08/2012   ALKPHOS 35 02/08/2012   BILITOT 0.4 07/11/2010 2000   GFRNONAA >60 07/11/2010 2000   GFRAA >60 07/11/2010 2000   Lab Results  Component Value Date   CHOL 236* 02/08/2012   HDL 49 02/08/2012   LDLCALC 161 02/08/2012   TRIG 317* 02/08/2012   Lab Results  Component Value Date   HGBA1C  Value: 6.0 (NOTE) The ADA recommends the following therapeutic goal for glycemic control related to Hgb A1c measurement: Goal of therapy: <6.5 Hgb A1c  Reference: American Diabetes Association: Clinical Practice Recommendations 2010, Diabetes Care, 2010, 33: (Suppl  1). 12/18/2008   No results found for this basename: VITAMINB12   No results found for this basename: TSH    I reviewed images myself and agree with interpretation.   01/09/09 CT head - nothing acute   ASSESSMENT AND PLAN  71 y.o. year old female here with moderate dementia. Now with hallucinations and worsening depression. Diarrhea still problematic.  PLAN: - Reduce exelon to 3mg  once a day (to help with diarrhea) - Increase citalopram to 40mg  daily (to help with depression) - Transition to Namenda XR 28mg  daily - consider seroquel in future if agitation significantly worsens; reassurance and redirection advised for now if patient gets disoriented, and husband is doing an excellent job with this.   Return in about 6 months (around 04/23/2013) for with Heide Guile or Penumalli.    Suanne Marker, MD 10/23/2012, 4:13 PM Certified in Neurology, Neurophysiology and Neuroimaging  [facility] [address] [place], [place] [address] [phone]

## 2012-10-23 NOTE — Patient Instructions (Signed)
Reduce exelon to 3mg  once a day.  Increase citalopram to 40mg  daily.  Transition to Namenda XR 28mg  daily.

## 2012-11-04 ENCOUNTER — Encounter (HOSPITAL_COMMUNITY): Payer: Self-pay | Admitting: Emergency Medicine

## 2012-11-04 ENCOUNTER — Emergency Department (INDEPENDENT_AMBULATORY_CARE_PROVIDER_SITE_OTHER)
Admission: EM | Admit: 2012-11-04 | Discharge: 2012-11-04 | Disposition: A | Discharge status: Home / Self Care | Payer: Medicare Other | Source: Home / Self Care | Attending: Emergency Medicine | Admitting: Emergency Medicine

## 2012-11-04 DIAGNOSIS — H6120 Impacted cerumen, unspecified ear: Secondary | ICD-10-CM

## 2012-11-04 DIAGNOSIS — H6123 Impacted cerumen, bilateral: Secondary | ICD-10-CM

## 2012-11-04 NOTE — ED Provider Notes (Signed)
Chief Complaint:   Chief Complaint  Patient presents with  . Cerumen Impaction    History of Present Illness:   Jaclyn Hernandez is a 72 year old female with Alzheimer's disease who complains of congestion in both of her ears. She denies any ear pain or drainage. She's had no fever, chills, nasal congestion, rhinorrhea, sore throat, adenopathy, or cough. She called her primary care physician and was told that they did not irrigate ears and so she was referred here for ear irrigation.  Review of Systems:  Other than noted above, the patient denies any of the following symptoms: Systemic:  No fevers, chills, sweats, weight loss or gain, fatigue, or tiredness. Eye:  No redness, pain, discharge, itching, blurred vision, or diplopia. ENT:  No headache, nasal congestion, sneezing, itching, epistaxis, ear pain, congestion, decreased hearing, ringing in ears, vertigo, or tinnitus.  No oral lesions, sore throat, pain on swallowing, or hoarseness. Neck:  No mass, tenderness or adenopathy. Lungs:  No coughing, wheezing, or shortness of breath. Skin:  No rash or itching.  PMFSH:  Past medical history, family history, social history, meds, and allergies were reviewed.  Physical Exam:   Vital signs:  BP 104/62  Pulse 80  Temp(Src) 98.3 F (36.8 C) (Oral)  Resp 16  SpO2 98% General:  Alert and oriented.  In no distress.  Skin warm and dry. She is pleasantly confused. Eye:  PERRL, full EOMs, lids and conjunctiva normal.   ENT:  There was a small amount of wax in both ear canals, this was easily irrigated clear, and thereafter the canals and TMs appear normal.  Nasal mucosa not congested and without drainage.  Mucous membranes moist, no oral lesions, normal dentition, pharynx clear.  No cranial or facial pain to palplation. Neck:  Supple, full ROM.  No adenopathy, tenderness or mass.  Thyroid normal. Lungs:  Breath sounds clear and equal bilaterally.  No wheezes, rales or rhonchi. Heart:  Rhythm regular,  without extrasystoles.  No gallops or murmers. Skin:  Clear, warm and dry.  Course in Urgent Care Center:   Both ears were irrigated. She states she felt better afterwards.  Assessment:  The encounter diagnosis was Cerumen impaction, bilateral.  The amount of cerumen was relatively small. She may be having some neurosensory hearing loss as the cause for her ear symptoms.  Plan:   1.  Meds:  The following meds were prescribed:   Discharge Medication List as of 11/04/2012  5:00 PM      2.  Patient Education/Counseling:  The patient was given appropriate handouts, self care instructions, and instructed in symptomatic relief.  Suggest no acute tips in the ears.  3.  Follow up:  The patient was told to follow up if no better in 3 to 4 days, if becoming worse in any way, and given some red flag symptoms such as persistent difficulty hearing which would prompt immediate return.  Follow up with an ear nose and throat specialist if hearing problems persist.  Reuben Likes, MD 11/04/12 2035

## 2012-11-04 NOTE — ED Notes (Signed)
C/o build up of ear wax. Pt was told by primary to come here and have ears irrigated. Denies pain.

## 2012-12-10 ENCOUNTER — Ambulatory Visit: Payer: Self-pay | Admitting: Diagnostic Neuroimaging

## 2013-03-04 ENCOUNTER — Other Ambulatory Visit: Payer: Self-pay | Admitting: Gastroenterology

## 2013-03-23 ENCOUNTER — Emergency Department (HOSPITAL_COMMUNITY)
Admission: EM | Admit: 2013-03-23 | Discharge: 2013-03-23 | Disposition: A | Discharge status: Home / Self Care | Payer: Medicare Other | Attending: Emergency Medicine | Admitting: Emergency Medicine

## 2013-03-23 ENCOUNTER — Encounter (HOSPITAL_COMMUNITY): Payer: Self-pay | Admitting: Emergency Medicine

## 2013-03-23 DIAGNOSIS — F0390 Unspecified dementia without behavioral disturbance: Secondary | ICD-10-CM

## 2013-03-23 DIAGNOSIS — Z8719 Personal history of other diseases of the digestive system: Secondary | ICD-10-CM | POA: Insufficient documentation

## 2013-03-23 DIAGNOSIS — Z87891 Personal history of nicotine dependence: Secondary | ICD-10-CM | POA: Insufficient documentation

## 2013-03-23 DIAGNOSIS — G309 Alzheimer's disease, unspecified: Secondary | ICD-10-CM | POA: Insufficient documentation

## 2013-03-23 DIAGNOSIS — Z8739 Personal history of other diseases of the musculoskeletal system and connective tissue: Secondary | ICD-10-CM | POA: Insufficient documentation

## 2013-03-23 DIAGNOSIS — F329 Major depressive disorder, single episode, unspecified: Secondary | ICD-10-CM | POA: Insufficient documentation

## 2013-03-23 DIAGNOSIS — E119 Type 2 diabetes mellitus without complications: Secondary | ICD-10-CM | POA: Insufficient documentation

## 2013-03-23 DIAGNOSIS — Z79899 Other long term (current) drug therapy: Secondary | ICD-10-CM | POA: Insufficient documentation

## 2013-03-23 DIAGNOSIS — F411 Generalized anxiety disorder: Secondary | ICD-10-CM | POA: Insufficient documentation

## 2013-03-23 DIAGNOSIS — Z87440 Personal history of urinary (tract) infections: Secondary | ICD-10-CM | POA: Insufficient documentation

## 2013-03-23 DIAGNOSIS — IMO0002 Reserved for concepts with insufficient information to code with codable children: Secondary | ICD-10-CM | POA: Insufficient documentation

## 2013-03-23 DIAGNOSIS — Z862 Personal history of diseases of the blood and blood-forming organs and certain disorders involving the immune mechanism: Secondary | ICD-10-CM | POA: Insufficient documentation

## 2013-03-23 DIAGNOSIS — Z8673 Personal history of transient ischemic attack (TIA), and cerebral infarction without residual deficits: Secondary | ICD-10-CM | POA: Insufficient documentation

## 2013-03-23 DIAGNOSIS — F0280 Dementia in other diseases classified elsewhere without behavioral disturbance: Secondary | ICD-10-CM | POA: Insufficient documentation

## 2013-03-23 DIAGNOSIS — N390 Urinary tract infection, site not specified: Secondary | ICD-10-CM | POA: Insufficient documentation

## 2013-03-23 DIAGNOSIS — F3289 Other specified depressive episodes: Secondary | ICD-10-CM | POA: Insufficient documentation

## 2013-03-23 LAB — CBC
HCT: 33.6 % — ABNORMAL LOW (ref 36.0–46.0)
Hemoglobin: 11.7 g/dL — ABNORMAL LOW (ref 12.0–15.0)
MCH: 37.3 pg — ABNORMAL HIGH (ref 26.0–34.0)
MCHC: 34.8 g/dL (ref 30.0–36.0)
MCV: 107 fL — ABNORMAL HIGH (ref 78.0–100.0)
Platelets: 44 10*3/uL — ABNORMAL LOW (ref 150–400)
RBC: 3.14 MIL/uL — AB (ref 3.87–5.11)
RDW: 15.3 % (ref 11.5–15.5)
WBC: 4.9 10*3/uL (ref 4.0–10.5)

## 2013-03-23 LAB — URINE MICROSCOPIC-ADD ON

## 2013-03-23 LAB — URINALYSIS, ROUTINE W REFLEX MICROSCOPIC
Bilirubin Urine: NEGATIVE
Glucose, UA: 500 mg/dL — AB
Ketones, ur: 15 mg/dL — AB
Nitrite: POSITIVE — AB
PH: 5 (ref 5.0–8.0)
PROTEIN: NEGATIVE mg/dL
Specific Gravity, Urine: 1.018 (ref 1.005–1.030)
Urobilinogen, UA: 0.2 mg/dL (ref 0.0–1.0)

## 2013-03-23 LAB — BASIC METABOLIC PANEL
BUN: 18 mg/dL (ref 6–23)
CALCIUM: 9.5 mg/dL (ref 8.4–10.5)
CHLORIDE: 102 meq/L (ref 96–112)
CO2: 25 meq/L (ref 19–32)
Creatinine, Ser: 0.75 mg/dL (ref 0.50–1.10)
GFR calc Af Amer: >90 mL/min (ref >90)
GFR calc non Af Amer: 81 mL/min — ABNORMAL LOW (ref >90)
Glucose, Bld: 274 mg/dL — ABNORMAL HIGH (ref 70–99)
Potassium: 3.8 mEq/L (ref 3.7–5.3)
SODIUM: 143 meq/L (ref 137–147)

## 2013-03-23 MED ORDER — CEPHALEXIN 500 MG PO CAPS: 500.0000 mg | ORAL_CAPSULE | Freq: Four times a day (QID) | ORAL | Status: DC

## 2013-03-23 MED ORDER — CEFTRIAXONE SODIUM 1 G IJ SOLR
1.0000 g | Freq: Once | INTRAMUSCULAR | Status: AC
  Administered 2013-03-23: 1 g via INTRAMUSCULAR
  Filled 2013-03-23: qty 10

## 2013-03-23 MED ORDER — LIDOCAINE HCL (PF) 1 % IJ SOLN
INTRAMUSCULAR | Status: AC
Start: 2013-03-23 — End: 2013-03-23
  Administered 2013-03-23: 2 mL
  Filled 2013-03-23: qty 5

## 2013-03-23 NOTE — ED Provider Notes (Signed)
CSN: [id number]     Arrival date & time 03/23/13  0719 History   First MD Initiated Contact with Patient 03/23/13 [phone]     Chief Complaint  Patient presents with  . Altered Mental Status     Level V caveat: Dementia  HPI Patient presents the emergency department on arrival the emergency department she asked that her husband leave the room.  When asked to this man was that she asked to leave she does not know who it was.  Her complaint is that she is in a relationship with to manage she is seeking advice on how to deal with the situation.  I spoke with the patient's husband at length who reports that she has dementia and seems to be increasingly confused over the past several days.  No reported fever or vomiting.  Her appetite has been good.  No reported diarrhea or recent sick contacts.  I reviewed her recent neurology note in October 2014 that demonstrated the patient had mild to moderate dementia and medications were assisting and that at that time the husband was having success redirecting the patient regarding memory issues as they came up.  The patient does not know the name of the 2 gentleman she states she is in a relationship with.  I asked the husband regarding this and he states there is no possibility of her in a second relationship.  She does not leave the house without the husband.   Past Medical History  Diagnosis Date  . Diabetes mellitus   . Alzheimer's disease   . Urinary tract infection, site not specified   . Hematuria, unspecified   . Chronic fatigue syndrome   . Alzheimer's dementia   . Colonic dysfunction   . Fibromyalgia   . Anemia   . Polymyalgia rheumatica   . Depression   . Anxiety   . Pancreatic insufficiency   . Unspecified transient cerebral ischemia    Past Surgical History  Procedure Laterality Date  . None     Family History  Problem Relation Age of Onset  . Cancer Mother   . Diabetes Father    History  Substance Use Topics  . Smoking status:  Former Smoker    Quit date: 01/02/1982  . Smokeless tobacco: Not on file  . Alcohol Use: No     Comment: quit in 1982   OB History   Grav Para Term Preterm Abortions TAB SAB Ect Mult Living                 Review of Systems  Unable to perform ROS: Dementia      Allergies  Sulfa antibiotics  Home Medications   Current Outpatient Rx  Name  Route  Sig  Dispense  Refill  . acetaminophen (TYLENOL) 500 MG tablet   Oral   Take 500 mg by mouth every 6 (six) hours as needed for moderate pain or headache.         . citalopram (CELEXA) 40 MG tablet   Oral   Take 40 mg by mouth daily.         . Memantine HCl ER (NAMENDA XR) 28 MG CP24   Oral   Take 1 capsule by mouth daily.         . mirtazapine (REMERON) 45 MG tablet   Oral   Take 45 mg by mouth at bedtime.         . Pancrelipase, Lip-Prot-Amyl, (CREON PO)   Oral   Take 1 capsule  by mouth 3 (three) times daily.          . potassium chloride SA (K-DUR,KLOR-CON) 20 MEQ tablet   Oral   Take 20 mEq by mouth daily.         . predniSONE (DELTASONE) 10 MG tablet   Oral   Take 15 mg by mouth daily with breakfast.         . rivastigmine (EXELON) 3 MG capsule   Oral   Take 1 capsule (3 mg total) by mouth daily.   30 capsule   12   . simvastatin (ZOCOR) 20 MG tablet   Oral   Take 20 mg by mouth daily.          . sitaGLIPtan (JANUVIA) 100 MG tablet   Oral   Take 100 mg by mouth daily.          . cephALEXin (KEFLEX) 500 MG capsule   Oral   Take 1 capsule (500 mg total) by mouth 4 (four) times daily.   28 capsule   0    BP 123/65  Pulse 102  Temp(Src) 98.8 F (37.1 C) (Oral)  Resp 18  SpO2 97% Physical Exam  Nursing note and vitals reviewed. Constitutional: She appears well-developed and well-nourished. No distress.  HENT:  Head: Normocephalic and atraumatic.  Eyes: EOM are normal.  Neck: Normal range of motion.  Cardiovascular: Normal rate, regular rhythm and normal heart sounds.    Pulmonary/Chest: Effort normal and breath sounds normal.  Abdominal: Soft. She exhibits no distension. There is no tenderness.  Musculoskeletal: Normal range of motion.  Neurological: She is alert.  Oriented to Person but not place or time  Skin: Skin is warm and dry.  Psychiatric: She has a normal mood and affect. Judgment normal.    ED Course  Procedures (including critical care time) Labs Review Labs Reviewed  CBC - Abnormal; Notable for the following:    RBC 3.14 (*)    Hemoglobin 11.7 (*)    HCT 33.6 (*)    MCV 107.0 (*)    MCH 37.3 (*)    Platelets 44 (*)    All other components within normal limits  BASIC METABOLIC PANEL - Abnormal; Notable for the following:    Glucose, Bld 274 (*)    GFR calc non Af Amer 81 (*)    All other components within normal limits  URINALYSIS, ROUTINE W REFLEX MICROSCOPIC - Abnormal; Notable for the following:    APPearance CLOUDY (*)    Glucose, UA 500 (*)    Hgb urine dipstick LARGE (*)    Ketones, ur 15 (*)    Nitrite POSITIVE (*)    Leukocytes, UA LARGE (*)    All other components within normal limits  URINE MICROSCOPIC-ADD ON - Abnormal; Notable for the following:    Bacteria, UA MANY (*)    All other components within normal limits  URINE CULTURE   Imaging Review No results found.   EKG Interpretation None      MDM   Final diagnoses:  Urinary tract infection  Dementia    Given that her husband is report of increasing confusion over the past several days the patient will undergo labs and urinalysis.  No recent trauma to suggest head injury.  We'll monitor closely.  The patient is easily redirectable this time.  She was given information to read about geriatric depression which seems to assist with her concerns at this time.  No signs of Elder abuse  11:13 AM  Will tx UTI. Urine culture. Home with abx. Family updated. Close pcp follow up    Hoy Morn, MD 03/23/13 1113

## 2013-03-23 NOTE — ED Notes (Signed)
EDP at the bedside.  ?

## 2013-03-23 NOTE — ED Notes (Addendum)
Patient came in complaining of fall. Upon arrival, patient asked for companion to leave. Once gone, patient spoke about memory problems that have been getting worse. She states, "I am worried about a relationship I am in with a man." Patient states, "He has not harmed me, but I feel he could." Patient denies thoughts of harming herself and others. Spoke with husband separately. Patient is "getting worse" with dementia. She has been more confused in the last four days. Patient's husband is worried that she fell in the night. MD notified.

## 2013-03-23 NOTE — Discharge Instructions (Signed)
Urinary Tract Infection Urinary tract infections (UTIs) can develop anywhere along your urinary tract. Your urinary tract is your body's drainage system for removing wastes and extra water. Your urinary tract includes two kidneys, two ureters, a bladder, and a urethra. Your kidneys are a pair of bean-shaped organs. Each kidney is about the size of your fist. They are located below your ribs, one on each side of your spine. CAUSES Infections are caused by microbes, which are microscopic organisms, including fungi, viruses, and bacteria. These organisms are so small that they can only be seen through a microscope. Bacteria are the microbes that most commonly cause UTIs. SYMPTOMS  Symptoms of UTIs may vary by age and gender of the patient and by the location of the infection. Symptoms in young women typically include a frequent and intense urge to urinate and a painful, burning feeling in the bladder or urethra during urination. Older women and men are more likely to be tired, shaky, and weak and have muscle aches and abdominal pain. A fever may mean the infection is in your kidneys. Other symptoms of a kidney infection include pain in your back or sides below the ribs, nausea, and vomiting. DIAGNOSIS To diagnose a UTI, your caregiver will ask you about your symptoms. Your caregiver also will ask to provide a urine sample. The urine sample will be tested for bacteria and white blood cells. White blood cells are made by your body to help fight infection. TREATMENT  Typically, UTIs can be treated with medication. Because most UTIs are caused by a bacterial infection, they usually can be treated with the use of antibiotics. The choice of antibiotic and length of treatment depend on your symptoms and the type of bacteria causing your infection. HOME CARE INSTRUCTIONS  If you were prescribed antibiotics, take them exactly as your caregiver instructs you. Finish the medication even if you feel better after you  have only taken some of the medication.  Drink enough water and fluids to keep your urine clear or pale yellow.  Avoid caffeine, tea, and carbonated beverages. They tend to irritate your bladder.  Empty your bladder often. Avoid holding urine for long periods of time.  Empty your bladder before and after sexual intercourse.  After a bowel movement, women should cleanse from front to back. Use each tissue only once. SEEK MEDICAL CARE IF:   You have back pain.  You develop a fever.  Your symptoms do not begin to resolve within 3 days. SEEK IMMEDIATE MEDICAL CARE IF:   You have severe back pain or lower abdominal pain.  You develop chills.  You have nausea or vomiting.  You have continued burning or discomfort with urination. MAKE SURE YOU:   Understand these instructions.  Will watch your condition.  Will get help right away if you are not doing well or get worse. Document Released: 09/28/2004 Document Revised: 06/20/2011 Document Reviewed: 01/27/2011 [facility] Patient Information 2014 [place].  Delirium Delirium (acute confusional state) is a sudden change in a person's brain function that causes the person to become confused for a short period of time. People with delirium often have trouble knowing where they are. Delirium comes on very fast. It can develop in a few days or just a few hours. Delirium usually occurs because of another mental or physical condition. For example, a person might develop delirium after a surgery. It is especially common in elderly people who are sick or in the hospital. People with dementia (a brain disease like  Alzheimer's) or people who are near death may also develop delirium.  CAUSES  Delirium occurs when something affects the signals that the brain sends out. These signals can be affected by anything that puts stress on the body and brain, causing brain chemicals to be out of balance. Structural health problems such as acute strokes,  bleeding near the brain (intracranial bleeds), and trauma can also cause delirium. Sometimes the exact cause of delirium is not known. Usually, several factors contribute to the development of delirium. Things that may cause a person to develop delirium include:  Surgery, especially when anesthetics are involved.  Chronic medical conditions, such as chronic lung, heart, or kidney disease.  Fever.  Low body temperature (hypothermia).  Infection, such as pneumonia, severe intestinal infection, severe skin infection, or urinary tract infection.  Poor nutrition that leads to very low vitamin or protein levels in the body (malnutrition).  Body fluid loss (dehydration).  Low blood sugar.  High blood sugar, which typically occurs in people with severe diabetes.  Electrolyte abnormalities, such as sodium imbalance or acid-base disorders.  Low oxygen level.  Low blood pressure.  Uncontrolled high blood pressure.  Brain injury (trauma).  Stroke.  Abuse of alcohol or sudden withdrawal of alcohol.  Sudden tobacco withdrawal if the person is a longtime smoker.  Loss of vision or hearing.  Being strapped down (restrained).  Being in a new setting, such as an elderly person being admitted to a hospital.  Taking illegal drugs or quitting use of those drugs.  Taking certain medicines for pain, sleep, allergies, high blood pressure, anxiety, depression, Parkinson's disease, or seizures.  Sundowning syndrome, which is a complication of chronic dementia that can occur in the later part of a person's wake cycle. SYMPTOMS  The main sign of delirium is a sudden change in a person's mental state. This change can come and go. Symptoms may include:  Not being able to pay attention.  Being confused about places, time, and people.  Seeing, hearing, or feeling things that are not real (hallucinations).  Changes in sleep patterns.  Being restless, hyperactive, irritable, and  angry.  Extreme mood swings.  Rambling and senseless talking.  Difficulty speaking or understanding speech.  Memory loss.  Changes in consciousness, such as being sleepy, sluggish, lethargic, and withdrawn.  Focusing on things or ideas that are not important.  Unusual body movements or shaking (tremors). DIAGNOSIS  There are no specific tests for delirium, but a caregiver may do the following:  Perform a mental status assessment.The caregiver will check for confusion and lack of awareness by talking with the person and asking questions.  Talk with the person's friends and family. A friend or family member will often need to tell the caregiver about the person's symptoms and medical history, including medicines taken or missed.  Perform a physical exam. The caregiver will perform a physical exam to check for underlying conditions that may lead to delirium, such as dehydration, malnutrition, trauma, and infection. The exam may include checking for changes in vision, hearing, and the way the person moves (coordination and reflexes). The caregiver may order tests, such as:  Blood tests.  Urine tests.  Brain imaging, such as a CT scan or MRI scan.  X-rays (to look for lung problems or intestinal blockage). TREATMENT  Treatment will focus on the cause of the delirium. Delirium is a sign of another problem. If that problem can be found and treated, the delirium may go away. Full recovery can take several  weeks. Keeping the person safe until the cause can be found and treated (supportive care) is often what is needed. In some cases, medicine may be prescribed to help keep the person calm. People with delirium should not be left alone because they may involuntarily harm themselves. HOME CARE INSTRUCTIONS  Supportive care is provided by the person living with, or caring for, the person with delirium. It involves keeping the person safe, encouraging healthy habits, and helping the person stay  aware of his or her surroundings. Take these steps to help care for someone with delirium:  Make sure the person eats a healthy diet.  Make sure the person gets enough fluids. The person should drink enough fluids to keep urine clear or pale yellow.  Do not leave the person alone.  Keep the person on a regular schedule. Maintain regular times for meals, sleeping, and being active.  Keep the home as quiet and stress-free as possible. This is very important at night and will help improve the quality of the person's sleep.  Let lots of sunlight into the home during the day.  Avoid total darkness at night.  Help the person maintain good hygiene to avoid skin infections, bed sores, and pressure ulcers.  Do activities outside as often as possible.  Take the person to see others whenever you can.  Make sure the person uses hearing aids and eyeglasses if needed.  Give frequent verbal reminders of the current time, location, and situation.  Use memory cues, such as clocks, calendars, and family photos.  Try to keep the person calm. Music or relaxation techniques may be helpful.  Always be on the lookout for symptoms of delirium.  Do not use restraints.  Only give over-the-counter or prescription medicines as directed by the caregiver.  Make sure the person takes all medicines on a regular schedule as directed by the caregiver.  Keep all follow-up appointments. SEEK MEDICAL CARE IF:  Any of the person's symptoms become worse.  New signs of delirium develop.  Caring for the person at home does not seem safe.  The person stops eating, drinking, or communicating.  The person develops vomiting. SEEK IMMEDIATE MEDICAL CARE IF:   Symptoms of delirium do not go away after treatment.  The person develops chest pain or shortness of breath.  The person seems to want to harm someone or harm himself or herself. MAKE SURE YOU:   Understand these instructions.  Will watch the  condition.  Will get help right away if the person is not doing well or gets worse. Document Released: 09/13/2011 Document Reviewed: 09/13/2011 [facility] Patient Information 2014 [place], [place].

## 2013-03-23 NOTE — Progress Notes (Signed)
CM consult for discharge assistance.Met patients husband.Patient has dementia.Husband reports he resides at home with his wife and has a Charity fundraiser once a week. Husband provided education on CHOICE list-Home health Options but elects to continue with private sitter at present.Husband encouraged to follow up with PCP and also Educated they can provide Home health orders if Mr Depree elected to have services placed for his wife.Mr Kolden provided with a resource packet for private sitters for the [place] area.No further CM needs identified at this time.

## 2013-03-25 LAB — URINE CULTURE: Colony Count: >=100000

## 2013-04-23 ENCOUNTER — Ambulatory Visit (INDEPENDENT_AMBULATORY_CARE_PROVIDER_SITE_OTHER): Payer: Medicare Other | Admitting: Diagnostic Neuroimaging

## 2013-04-23 ENCOUNTER — Encounter (INDEPENDENT_AMBULATORY_CARE_PROVIDER_SITE_OTHER): Payer: Self-pay

## 2013-04-23 ENCOUNTER — Encounter: Payer: Self-pay | Admitting: Diagnostic Neuroimaging

## 2013-04-23 VITALS — BP 119/69 | HR 92 | Ht 62.0 in | Wt 114.0 lb

## 2013-04-23 DIAGNOSIS — F03B18 Unspecified dementia, moderate, with other behavioral disturbance: Secondary | ICD-10-CM

## 2013-04-23 DIAGNOSIS — F0391 Unspecified dementia with behavioral disturbance: Secondary | ICD-10-CM

## 2013-04-23 DIAGNOSIS — F03918 Unspecified dementia, unspecified severity, with other behavioral disturbance: Secondary | ICD-10-CM

## 2013-04-23 NOTE — Progress Notes (Signed)
[facility]  PATIENT: Jaclyn Hernandez DOB: 24-Feb-1940  REFERRING CLINICIAN:  HISTORY FROM: patient and husband REASON FOR VISIT: follow up   HISTORICAL  CHIEF COMPLAINT:  Chief Complaint  Patient presents with  . Follow-up    memory    HISTORY OF PRESENT ILLNESS:   UPDATE 04/23/13: Since last visit patient is stable. She is tolerating her medications. Diarrhea has improved on lower dose Exelon. She did have emergency room visit for altered mental status last month, was diagnosed with UTI and treated with antibiotics. She made a fairly good recovery following this. Patient continues to stay active. She still needs some help at home. Husband feels comfortable managing her needs on a day-to-day basis.  UPDATE 10/23/12: Since last visit, memory continues to decline. Over past few days, has had several episodes of waking up in the middle the night and feeling like she is not in her own home. She would tell her husband "let's go home". Husband with point at the photos on the walls and other objects in the home and reminded her that she is in her own home. She would accept this for several minutes and 10 minutes later, ask to "go home". This happened several times over the last couple of days. Patient has had episodes of feeling like she is living in the past (10-13 years ago). Patient has had some vivid dreams and hallucinations. She is not a danger to herself or to others at this time. She has not wandered out of the home. Patient continues to struggle with intermittent diarrhea every one to 2 weeks. This may have slightly improved since slight reduction in Exelon dose from one year ago. Depression seems to be getting worse and patient has tendency to cry spontaneously without any specific cause.  PRIOR HPI (10/03/11, Dr. Erling Cruz): 73 year old right-handed African American  married female with a positive family history of Alzheimer's disease and personal history of progressive  memory loss that I have followed since 1994. 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 CSF evaluation has been positive for Alzheimer's disease and also positive for oligoclonal banding 04/20/2000. Positive CSF oligoclonal banding can be seen with dementias. EEG 03/28/2000 was normal.  Neuropsychological testing 06/2006 showing numerous difficulties below expectations compared to her level of  functioning with diifficulty manipulating information in  temporary visual memory and learning and retaining information. She had side effects from Aricept and Razadyne and is currently on Exelon 3 mg tablets,2 in the morning and 1 at night. Higher doses cause diarrhea and she is currently having diarrhea. She is on Namenda 10 mg twice daily. She is independent in her activities of daily living. She is not driving . She doesn't pay the bills. She exercises by walking 60 minutes with her dogs 5 days a week. She shops with her husband. She notes progression of her memory loss. She denies depression. She denies daytime sleepiness, weight change, falls, or bowel or bladder incontinence.10/13/10= MMSE 17/30. Clock Drawing Task  1/4. Animal fluency test 8.Index for independence in activities of daily living 6. [place] instrumental activities of daily living scale 3.Neuropsychiatric inventory=agitation 4, apathy 5, irritability 4. Neuropsychiatric evaluation last done 06/2006. Geriatric depression scale 0/15 Falls assessment tool score 7. 02/08/11 = MMSE  15/30. CDT 2/4. AFT 12.Katz Index of independence in activities of daily living 6 .[place]-Brody Instrumental activities of daily living scale 3. Neuropsychiatric inventory=depression 6. Geriatric depression scale  1/15. Falls assessment tool score 7. she is having diarrhea but no muscle cramps. She enjoys social activities with her church. Recently they went to [place] for a museum  trip. Her spirits are  good. Last blood studies were 2 weeks ago.10/1/2013MMSE 18/30.Clock drawing test 2/4. Animal fluency test 7. Ron Parker 6. [place]-Brody 3.Neuropsychiatric inventory=0.  Geriatric depression scale 1/15 Falls assessment tool score 7.  REVIEW OF SYSTEMS: Full 14 system review of systems performed and notable only for memory loss only.    ALLERGIES: Allergies  Allergen Reactions  . Sulfa Antibiotics     unknown    HOME MEDICATIONS: Outpatient Prescriptions Prior to Visit  Medication Sig Dispense Refill  . cephALEXin (KEFLEX) 500 MG capsule Take 1 capsule (500 mg total) by mouth 4 (four) times daily.  28 capsule  0  . Memantine HCl ER (NAMENDA XR) 28 MG CP24 Take 1 capsule by mouth daily.      . Pancrelipase, Lip-Prot-Amyl, (CREON PO) Take 1 capsule by mouth 3 (three) times daily.       . potassium chloride SA (K-DUR,KLOR-CON) 20 MEQ tablet Take 20 mEq by mouth daily.      . predniSONE (DELTASONE) 10 MG tablet Take 15 mg by mouth daily with breakfast.      . sitaGLIPtan (JANUVIA) 100 MG tablet Take 100 mg by mouth daily.       . rivastigmine (EXELON) 3 MG capsule Take 1 capsule (3 mg total) by mouth daily.  30 capsule  12  . acetaminophen (TYLENOL) 500 MG tablet Take 500 mg by mouth every 6 (six) hours as needed for moderate pain or headache.      . citalopram (CELEXA) 40 MG tablet Take 40 mg by mouth daily.      . mirtazapine (REMERON) 45 MG tablet Take 45 mg by mouth at bedtime.      . simvastatin (ZOCOR) 20 MG tablet Take 20 mg by mouth daily.        No facility-administered medications prior to visit.    PAST MEDICAL HISTORY: Past Medical History  Diagnosis Date  . Diabetes mellitus   . Alzheimer's disease   . Urinary tract infection, site not specified   . Hematuria, unspecified   . Chronic  fatigue syndrome   . Alzheimer's dementia   . Colonic dysfunction   . Fibromyalgia   . Anemia   . Polymyalgia rheumatica   . Depression   . Anxiety   . Pancreatic insufficiency   . Unspecified transient cerebral ischemia     PAST SURGICAL HISTORY: Past Surgical History  Procedure Laterality Date  . None      FAMILY HISTORY: Family History  Problem Relation Age of Onset  . Cancer Mother   . Diabetes Father     SOCIAL HISTORY:  History   Social History  . Marital Status: Married    Spouse Name: Horlin    Number of Children: 2  . Years of Education: college   Occupational History  .      Minister   Social History Main Topics  . Smoking status: Former Smoker    Quit date: 01/02/1982  . Smokeless tobacco: Not on file  . Alcohol Use: No     Comment: quit in 1982  . Drug Use: No  . Sexual Activity: Yes   Other Topics  Concern  . Not on file   Social History Narrative   Patient lives at home with her spouse.   Caffeine Use:      PHYSICAL EXAM  Filed Vitals:   04/23/13 1357  BP: 119/69  Pulse: 92  Height: 5\' 2"  (1.575 m)  Weight: 114 lb (51.71 kg)    Not recorded    Body mass index is 20.85 kg/(m^2).  GENERAL EXAM: Patient is in no distress  CARDIOVASCULAR: Regular rate and rhythm, no murmurs, no carotid bruits  NEUROLOGIC: MENTAL STATUS: awake, alert, language fluent, comprehension intact, naming intact; POSITIVE SNOUT. NEG MYERSONS. MMSE 6/30. AFT 0. POOR EFFORT. DOESN'T WANT TO DO THIS TESTING. CRANIAL NERVE: no papilledema on fundoscopic exam, pupils equal and reactive to light, visual fields full to confrontation, extraocular muscles intact, no nystagmus, facial sensation and strength symmetric, uvula midline, shoulder shrug symmetric, tongue midline. MOTOR: normal bulk and tone, full strength in the BUE, BLE SENSORY: normal and symmetric to light touch COORDINATION: finger-nose-finger, fine finger movements normal REFLEXES: deep tendon  reflexes present and symmetric GAIT/STATION: narrow based gait   DIAGNOSTIC DATA (LABS, IMAGING, TESTING) - I reviewed patient records, labs, notes, testing and imaging myself where available.  Lab Results  Component Value Date   WBC 4.9 03/23/2013   HGB 11.7* 03/23/2013   HCT 33.6* 03/23/2013   MCV 107.0* 03/23/2013   PLT 44* 03/23/2013      Component Value Date/Time   NA 143 03/23/2013 0806   NA 139 02/08/2012   K 3.8 03/23/2013 0806   CL 102 03/23/2013 0806   CO2 25 03/23/2013 0806   GLUCOSE 274* 03/23/2013 0806   BUN 18 03/23/2013 0806   BUN 22* 02/08/2012   CREATININE 0.75 03/23/2013 0806   CREATININE 1.0 02/08/2012   CALCIUM 9.5 03/23/2013 0806   PROT 7.5 07/11/2010 2000   ALBUMIN 4.1 07/11/2010 2000   AST 18 02/08/2012   ALT 22 02/08/2012   ALKPHOS 35 02/08/2012   BILITOT 0.4 07/11/2010 2000   GFRNONAA 81* 03/23/2013 0806   GFRAA >90 03/23/2013 0806   Lab Results  Component Value Date   CHOL 236* 02/08/2012   HDL 49 02/08/2012   LDLCALC 124 02/08/2012   TRIG 317* 02/08/2012   Lab Results  Component Value Date   HGBA1C  Value: 6.0 (NOTE) The ADA recommends the following therapeutic goal for glycemic control related to Hgb A1c measurement: Goal of therapy: <6.5 Hgb A1c  Reference: American Diabetes Association: Clinical Practice Recommendations 2010, Diabetes Care, 2010, 33: (Suppl  1). 12/18/2008   No results found for this basename: VITAMINB12   No results found for this basename: TSH    I reviewed images myself and agree with interpretation.   01/09/09 CT head - nothing acute   ASSESSMENT AND PLAN  73 y.o. year old female here with moderate dementia, intermittent hallucinations and intermittent depression. Stable from husband standpoint, but seems to be progressing on cognitive testing   PLAN: - Continue exelon, Namenda XR and citalopram - At patient's request, she will follow up here as needed. If she, husband or PCP request, I would be glad to see her again in future for follow  up.  Return if symptoms worsen or fail to improve, for return to PCP.    Penni Bombard, MD 06/18/735, 1:06 PM Certified in Neurology, Neurophysiology and Neuroimaging  [facility] [address], [place],  [address] [phone]

## 2013-04-23 NOTE — Patient Instructions (Signed)
Continue current medications. 

## 2013-09-22 ENCOUNTER — Encounter (HOSPITAL_COMMUNITY): Payer: Self-pay | Admitting: Emergency Medicine

## 2013-09-22 ENCOUNTER — Telehealth: Payer: Self-pay | Admitting: *Deleted

## 2013-09-22 ENCOUNTER — Emergency Department (INDEPENDENT_AMBULATORY_CARE_PROVIDER_SITE_OTHER)
Admission: EM | Admit: 2013-09-22 | Discharge: 2013-09-22 | Disposition: A | Discharge status: Home / Self Care | Payer: Medicare Other | Source: Home / Self Care | Attending: Family Medicine | Admitting: Family Medicine

## 2013-09-22 DIAGNOSIS — K5900 Constipation, unspecified: Secondary | ICD-10-CM

## 2013-09-22 DIAGNOSIS — K5641 Fecal impaction: Secondary | ICD-10-CM

## 2013-09-22 MED ORDER — POLYETHYLENE GLYCOL 3350 17 G PO PACK: 17.0000 g | PACK | Freq: Every day | ORAL | Status: DC

## 2013-09-22 NOTE — Discharge Instructions (Signed)
Constipation °Constipation is when a person has fewer than three bowel movements a week, has difficulty having a bowel movement, or has stools that are dry, hard, or larger than normal. As people grow older, constipation is more common. If you try to fix constipation with medicines that make you have a bowel movement (laxatives), the problem may get worse. Long-term laxative use may cause the muscles of the colon to become weak. A low-fiber diet, not taking in enough fluids, and taking certain medicines may make constipation worse.  °CAUSES  °· Certain medicines, such as antidepressants, pain medicine, iron supplements, antacids, and water pills.   °· Certain diseases, such as diabetes, irritable bowel syndrome (IBS), thyroid disease, or depression.   °· Not drinking enough water.   °· Not eating enough fiber-rich foods.   °· Stress or travel.   °· Lack of physical activity or exercise.   °· Ignoring the urge to have a bowel movement.   °· Using laxatives too much.   °SIGNS AND SYMPTOMS  °· Having fewer than three bowel movements a week.   °· Straining to have a bowel movement.   °· Having stools that are hard, dry, or larger than normal.   °· Feeling full or bloated.   °· Pain in the lower abdomen.   °· Not feeling relief after having a bowel movement.   °DIAGNOSIS  °Your health care provider will take a medical history and perform a physical exam. Further testing may be done for severe constipation. Some tests may include: °· A barium enema X-ray to examine your rectum, colon, and, sometimes, your small intestine.   °· A sigmoidoscopy to examine your lower colon.   °· A colonoscopy to examine your entire colon. °TREATMENT  °Treatment will depend on the severity of your constipation and what is causing it. Some dietary treatments include drinking more fluids and eating more fiber-rich foods. Lifestyle treatments may include regular exercise. If these diet and lifestyle recommendations do not help, your health care  provider may recommend taking over-the-counter laxative medicines to help you have bowel movements. Prescription medicines may be prescribed if over-the-counter medicines do not work.  °HOME CARE INSTRUCTIONS  °· Eat foods that have a lot of fiber, such as fruits, vegetables, whole grains, and beans. °· Limit foods high in fat and processed sugars, such as french fries, hamburgers, cookies, candies, and soda.   °· A fiber supplement may be added to your diet if you cannot get enough fiber from foods.   °· Drink enough fluids to keep your urine clear or pale yellow.   °· Exercise regularly or as directed by your health care provider.   °· Go to the restroom when you have the urge to go. Do not hold it.   °· Only take over-the-counter or prescription medicines as directed by your health care provider. Do not take other medicines for constipation without talking to your health care provider first.   °SEEK IMMEDIATE MEDICAL CARE IF:  °· You have bright red blood in your stool.   °· Your constipation lasts for more than 4 days or gets worse.   °· You have abdominal or rectal pain.   °· You have thin, pencil-like stools.   °· You have unexplained weight loss. °MAKE SURE YOU:  °· Understand these instructions. °· Will watch your condition. °· Will get help right away if you are not doing well or get worse. °Document Released: 09/17/2003 Document Revised: 12/24/2012 Document Reviewed: 09/30/2012 °ExitCare® Patient Information ©2015 [employer]. This information is not intended to replace advice given to you by your health care provider. Make sure you discuss any questions   you have with your health care provider.  Fecal Impaction A fecal impaction happens when there is a large, firm amount of stool (or feces) that cannot be passed. The impacted stool is usually in the rectum, which is the lowest part of the large bowel. The impacted stool can block the colon and cause significant problems. CAUSES  The longer stool  stays in the rectum, the harder it gets. Anything that slows down your bowel movements can lead to fecal impaction, such as:  Constipation. This can be a long-standing (chronic) problem or can happen suddenly (acute).  Painful conditions of the rectum, such as hemorrhoids or anal fissures. The pain of these conditions can make you try to avoid having bowel movements.  Narcotic pain-relieving medicines, such as methadone, morphine, or codeine.  Not drinking enough fluids.  Inactivity and bed rest over long periods of time.  Diseases of the brain or nervous system that damage the nerves controlling the muscles of the intestines. SIGNS AND SYMPTOMS   Lack of normal bowel movements or changes in bowel patterns.  Sense of fullness in the rectum but unable to pass stool.  Pain or cramps in the abdominal area (often after meals).  Thin, watery discharge from the rectum. DIAGNOSIS  Your health care provider may suspect that you have a fecal impaction based on your symptoms and a physical exam. This will include an exam of your rectum. Sometimes X-rays or lab testing may be needed to confirm the diagnosis and to be sure there are no other problems.  TREATMENT   Initially an impaction can be removed manually. Using a gloved finger, your health care provider can remove hard stool from your rectum.  Medicine is sometimes needed. A suppository or enema can be given in the rectum to soften the stool, which can stimulate a bowel movement. Medicines can also be given by mouth (orally).  Though rare, surgery may be needed if the colon has torn (perforated) due to blockage. HOME CARE INSTRUCTIONS   Develop regular bowel habits. This could include getting in the habit of having a bowel movement after your morning cup of coffee or after eating. Be sure to allow yourself enough time on the toilet.  Maintain a high-fiber diet.  Drink enough fluids to keep your urine clear or pale yellow as directed by  your health care provider.  Exercise regularly.  If you begin to get constipated, increase the amount of fiber in your diet. Eat plenty of fruits, vegetables, whole wheat breads, bran, oatmeal, and similar products.  Take natural fiber laxatives or other laxatives only as directed by your health care provider. SEEK MEDICAL CARE IF:   You have ongoing rectal pain.  You require enemas or suppositories more than twice a week.  You have rectal bleeding.  You have continued problems, or you develop abdominal pain.  You have thin, pencil-like stools. SEEK IMMEDIATE MEDICAL CARE IF:  You have black or tarry stools. MAKE SURE YOU:   Understand these instructions.  Will watch your condition.  Will get help right away if you are not doing well or get worse. Document Released: 09/11/2003 Document Revised: 10/09/2012 Document Reviewed: 06/25/2012 [facility] Patient Information 2015 [place], [place]. This information is not intended to replace advice given to you by your health care provider. Make sure you discuss any questions you have with your health care provider.

## 2013-09-22 NOTE — ED Provider Notes (Signed)
CSN: [id number]     Arrival date & time 09/22/13  1440 History   First MD Initiated Contact with Patient 09/22/13 1544     Chief Complaint  Patient presents with  . Constipation   (Consider location/radiation/quality/duration/timing/severity/associated sxs/prior Treatment) HPI Comments: Patient suffers from dementia and husband, who is her full time care giver, states she is constipated and has been unable to pass stool for the 2-3 days. States he brings her here to address the constipation.   Patient is a 73 y.o. female presenting with constipation. The history is provided by the spouse.  Constipation   Past Medical History  Diagnosis Date  . Diabetes mellitus   . Alzheimer's disease   . Urinary tract infection, site not specified   . Hematuria, unspecified   . Chronic fatigue syndrome   . Alzheimer's dementia   . Colonic dysfunction   . Fibromyalgia   . Anemia   . Polymyalgia rheumatica   . Depression   . Anxiety   . Pancreatic insufficiency   . Unspecified transient cerebral ischemia    Past Surgical History  Procedure Laterality Date  . None     Family History  Problem Relation Age of Onset  . Cancer Mother   . Diabetes Father    History  Substance Use Topics  . Smoking status: Former Smoker    Quit date: 01/02/1982  . Smokeless tobacco: Not on file  . Alcohol Use: No     Comment: quit in 1982   OB History   Grav Para Term Preterm Abortions TAB SAB Ect Mult Living                 Review of Systems  Unable to perform ROS: Dementia  Gastrointestinal: Positive for constipation.    Allergies  Sulfa antibiotics  Home Medications   Prior to Admission medications   Medication Sig Start Date End Date Taking? Authorizing Provider  cephALEXin (KEFLEX) 500 MG capsule Take 1 capsule (500 mg total) by mouth 4 (four) times daily. 03/23/13   Hoy Morn, MD  citalopram (CELEXA) 20 MG tablet Take 20 mg by mouth daily.    Historical Provider, MD  glimepiride  (AMARYL) 1 MG tablet Take 1 mg by mouth daily with breakfast.    Historical Provider, MD  loperamide (IMODIUM A-D) 2 MG tablet Take 2 mg by mouth 4 (four) times daily as needed for diarrhea or loose stools.    Historical Provider, MD  Memantine HCl ER (NAMENDA XR) 28 MG CP24 Take 1 capsule by mouth daily.    Historical Provider, MD  Multiple Vitamins-Minerals (WOMENS MULTIVITAMIN PLUS) TABS Take 1 tablet by mouth daily.    Historical Provider, MD  Pancrelipase, Lip-Prot-Amyl, (CREON PO) Take 1 capsule by mouth 3 (three) times daily.     Historical Provider, MD  polyethylene glycol (MIRALAX / GLYCOLAX) packet Take 17 g by mouth daily. Mix in to 8 oz of water and drink once daily 09/22/13   Audelia Hives Hari Casaus, PA  potassium chloride SA (K-DUR,KLOR-CON) 20 MEQ tablet Take 20 mEq by mouth daily.    Historical Provider, MD  predniSONE (DELTASONE) 10 MG tablet Take 15 mg by mouth daily with breakfast.    Historical Provider, MD  rivastigmine (EXELON) 3 MG capsule Take 3 mg by mouth daily. 2 capsules PO in a.m; 1 capsule at night 10/23/12   Penni Bombard, MD  sitaGLIPtan (JANUVIA) 100 MG tablet Take 100 mg by mouth daily.     Historical  Provider, MD   BP 112/70  Pulse 93  Temp(Src) 98.9 F (37.2 C) (Oral)  Resp 16  SpO2 99% Physical Exam  Nursing note and vitals reviewed. Constitutional: She appears well-developed and well-nourished. No distress.  HENT:  Head: Normocephalic and atraumatic.  Eyes: Conjunctivae are normal.  Cardiovascular: Normal rate, regular rhythm and normal heart sounds.   Pulmonary/Chest: Effort normal and breath sounds normal.  Abdominal: Soft. Normal appearance and bowel sounds are normal. She exhibits no distension. There is no tenderness. There is no CVA tenderness.  Genitourinary:  +large soft light brown fecal impaction  Musculoskeletal: Normal range of motion.  Neurological: She is alert.  Skin: Skin is warm and dry.  Psychiatric: She has a normal mood and  affect. Her behavior is normal.    ED Course  Fecal disimpaction Date/Time: 09/22/2013 4:37 PM Performed by: Griselda Miner LEE H Authorized by: Marily Memos, DAVID J Consent: Verbal consent obtained. written consent not obtained. Risks and benefits: risks, benefits and alternatives were discussed Consent given by: spouse Patient identity confirmed: verbally with patient Time out: Immediately prior to procedure a "time out" was called to verify the correct patient, procedure, equipment, support staff and site/side marked as required. Local anesthesia used: no Patient sedated: no Patient tolerance: Patient tolerated the procedure well with no immediate complications. Comments: +manual disimpaction of large bolus of fecal material   (including critical care time) Labs Review Labs Reviewed - No data to display  Imaging Review No results found.   MDM   1. Fecal impaction in rectum   2. Constipation, unspecified constipation type    Will advise patient's husband to begin giving her daily Miralax and that he may see some small amounts of red blood in her adult diaper from mechanical irritation of procedure. Will recommend she stay well hydrated and ensure adequate dietary fiber. Follow up with PCP if symptoms continue to reoccur.     Lutricia Feil, PA 09/22/13 [place], [place] 09/22/13 1650

## 2013-09-22 NOTE — ED Notes (Signed)
Pt has had abdominal pain and dizziness since yesterday, pt is also very weak, pt also complains of having difficulty passing stool, pt has a history of Alzheimer's disease

## 2013-09-22 NOTE — Telephone Encounter (Signed)
Husband calling about seeing pt for balance, BLE weakness.  Has seen pcp Kevan Ny) and eye doctor recently.  Both ok per husband.  Appt made for 09-24-13 0900. He verbalized understanding.

## 2013-09-23 NOTE — ED Provider Notes (Signed)
Medical screening examination/treatment/procedure(s) were performed by resident physician or non-physician practitioner and as supervising physician I was immediately available for consultation/collaboration.   Pauline Good MD.   Billy Fischer, MD 09/23/13 [phone]

## 2013-09-24 ENCOUNTER — Encounter: Payer: Self-pay | Admitting: Diagnostic Neuroimaging

## 2013-09-24 ENCOUNTER — Ambulatory Visit (INDEPENDENT_AMBULATORY_CARE_PROVIDER_SITE_OTHER): Payer: Medicare Other | Admitting: Diagnostic Neuroimaging

## 2013-09-24 VITALS — BP 95/66 | HR 93 | Ht 62.0 in | Wt 110.6 lb

## 2013-09-24 DIAGNOSIS — R531 Weakness: Secondary | ICD-10-CM

## 2013-09-24 DIAGNOSIS — F03B18 Unspecified dementia, moderate, with other behavioral disturbance: Secondary | ICD-10-CM

## 2013-09-24 DIAGNOSIS — R5383 Other fatigue: Secondary | ICD-10-CM

## 2013-09-24 DIAGNOSIS — F0391 Unspecified dementia with behavioral disturbance: Secondary | ICD-10-CM

## 2013-09-24 DIAGNOSIS — F03918 Unspecified dementia, unspecified severity, with other behavioral disturbance: Secondary | ICD-10-CM

## 2013-09-24 DIAGNOSIS — R5381 Other malaise: Secondary | ICD-10-CM

## 2013-09-24 NOTE — Progress Notes (Signed)
[facility]  PATIENT: Jaclyn Hernandez DOB: 06-18-1940  REFERRING CLINICIAN:  HISTORY FROM: patient and husband REASON FOR VISIT: follow up   HISTORICAL  CHIEF COMPLAINT:  Chief Complaint  Patient presents with  . Follow-up    dementia    HISTORY OF PRESENT ILLNESS:   UPDATE 09/24/13: Since last visit, has had progression of memory loss, generalized weakness, decr appetite, decr motivation to go outside, reduced phys activity. Was in ER for constipation recently. Husband struggling to help her on daily basis. He does have help from housekeeper x 1 day per week and patient goes to church based adult enrichment 3 x per week.  UPDATE 04/23/13: Since last visit patient is stable. She is tolerating her medications. Diarrhea has improved on lower dose Exelon. She did have emergency room visit for altered mental status last month, was diagnosed with UTI and treated with antibiotics. She made a fairly good recovery following this. Patient continues to stay active. She still needs some help at home. Husband feels comfortable managing her needs on a day-to-day basis.  UPDATE 10/23/12: Since last visit, memory continues to decline. Over past few days, has had several episodes of waking up in the middle the night and feeling like she is not in her own home. She would tell her husband "let's go home". Husband with point at the photos on the walls and other objects in the home and reminded her that she is in her own home. She would accept this for several minutes and 10 minutes later, ask to "go home". This happened several times over the last couple of days. Patient has had episodes of feeling like she is living in the past (10-13 years ago). Patient has had some vivid dreams and hallucinations. She is not a danger to herself or to others at this time. She has not wandered out of the home. Patient continues to struggle with intermittent diarrhea every one to 2 weeks. This may have  slightly improved since slight reduction in Exelon dose from one year ago. Depression seems to be getting worse and patient has tendency to cry spontaneously without any specific cause.  PRIOR HPI (10/03/11, Dr. Erling Cruz): 73 year old right-handed African American  married female with a positive family history of Alzheimer's disease and personal history of progressive memory loss that I have followed since 1994. 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 CSF evaluation has been positive for Alzheimer's disease and also positive for oligoclonal banding 04/20/2000. Positive CSF oligoclonal banding can be seen with dementias. EEG 03/28/2000 was normal.  Neuropsychological testing 06/2006 showing numerous difficulties below expectations compared to her level of  functioning with diifficulty manipulating information in  temporary visual memory and learning and retaining information. She had side effects from Aricept and Razadyne and is currently on Exelon 3 mg tablets,2 in the morning and 1 at night. Higher doses cause diarrhea and she is currently having diarrhea. She is on Namenda 10 mg twice daily. 73 She is independent in her activities of daily living. She is not driving . She doesn't pay the bills. She exercises by walking 60 minutes with her dogs 5 days a week. She shops with her husband. She notes progression of her memory loss. She denies depression. She denies  daytime sleepiness, weight change, falls, or bowel or bladder incontinence.75 MMSE 17/30. Clock Drawing Task  1/4. Animal fluency test 8.Index for independence in activities of daily living 6. [place] instrumental activities of daily living scale 3.Neuropsychiatric  inventory=agitation 4, apathy 5, irritability 4. Neuropsychiatric evaluation last done 06/2006. Geriatric depression scale 0/15 Falls assessment tool score 7. 02/08/11 = MMSE 15/30. CDT 2/4. AFT 12.Katz Index of independence in activities of daily living 6 .Lawton-Brody Instrumental activities of daily living scale 3. Neuropsychiatric inventory=depression 6. Geriatric depression scale 1/15. Falls assessment tool score 7. she is having diarrhea but no muscle cramps. She enjoys social activities with her church. Recently they went to [facility] for a museum  trip. Her spirits are  good. Last blood studies were 2 weeks ago.10/1/2013MMSE 18/30.Clock drawing test 2/4. Animal fluency test 7. Ron Parker 6. Lawton-Brody 3.Neuropsychiatric inventory=0.  Geriatric depression scale 1/15 Falls assessment tool score 7.  REVIEW OF SYSTEMS: Full 14 system review of systems performed and notable only for memory loss weakness.   ALLERGIES: Allergies  Allergen Reactions  . Sulfa Antibiotics     unknown    HOME MEDICATIONS: Outpatient Prescriptions Prior to Visit  Medication Sig Dispense Refill  . citalopram (CELEXA) 20 MG tablet Take 20 mg by mouth daily.      Marland Kitchen glimepiride (AMARYL) 1 MG tablet Take 1 mg by mouth daily with breakfast.      . loperamide (IMODIUM A-D) 2 MG tablet Take 2 mg by mouth 4 (four) times daily as needed for diarrhea or loose stools.      . Memantine HCl ER (NAMENDA XR) 28 MG CP24 Take 1 capsule by mouth daily.      . Multiple Vitamins-Minerals (WOMENS MULTIVITAMIN PLUS) TABS Take 1 tablet by mouth daily.      . Pancrelipase, Lip-Prot-Amyl, (CREON PO) Take 1 capsule by mouth 3 (three) times daily.       . polyethylene glycol (MIRALAX / GLYCOLAX) packet Take 17 g by mouth daily. Mix in to 8 oz of water and drink once daily  14 each  1  . potassium chloride SA (K-DUR,KLOR-CON) 20 MEQ tablet Take 20 mEq by mouth daily.      . predniSONE (DELTASONE) 10 MG tablet Take 15 mg by mouth daily with breakfast.       . rivastigmine (EXELON) 3 MG capsule Take 3 mg by mouth daily. 2 capsules PO in a.m; 1 capsule at night      . sitaGLIPtan (JANUVIA) 100 MG tablet Take 100 mg by mouth daily.       . cephALEXin (KEFLEX) 500 MG capsule Take 1 capsule (500 mg total) by mouth 4 (four) times daily.  28 capsule  0   No facility-administered medications prior to visit.    PAST MEDICAL HISTORY: Past Medical History  Diagnosis Date  . Diabetes mellitus   . Alzheimer's disease   . Urinary tract infection, site not specified   . Hematuria, unspecified   . Chronic fatigue syndrome   . Alzheimer's dementia   . Colonic dysfunction   . Fibromyalgia   . Anemia   . Polymyalgia rheumatica   . Depression   . Anxiety   . Pancreatic insufficiency   . Unspecified transient cerebral ischemia     PAST SURGICAL HISTORY: Past Surgical History  Procedure Laterality Date  . None      FAMILY HISTORY: Family History  Problem Relation Age of Onset  . Cancer Mother   . Diabetes Father  SOCIAL HISTORY:  History   Social History  . Marital Status: Married    Spouse Name: Horlin    Number of Children: 2  . Years of Education: college   Occupational History  .      Minister   Social History Main Topics  . Smoking status: Former Smoker    Quit date: 01/02/1982  . Smokeless tobacco: Never Used  . Alcohol Use: No     Comment: quit in 1982  . Drug Use: No  . Sexual Activity: Yes   Other Topics Concern  . Not on file   Social History Narrative   Patient lives at home with her spouse.   Caffeine Use:  occasionally     PHYSICAL EXAM  Filed Vitals:   09/24/13 1342  BP: 95/66  Pulse: 93  Height: 5\' 2"  (1.575 m)  Weight: 110 lb 9.6 oz (50.168 kg)    Not recorded    Body mass index is 20.22 kg/(m^2).  GENERAL EXAM: Patient is in no distress  CARDIOVASCULAR: Regular rate and rhythm, no murmurs, no carotid bruits  NEUROLOGIC: MENTAL STATUS: awake, alert, DECR FLUENCY, comprehension  intact, naming intact; POSITIVE SNOUT. NEG MYERSONS. MMSE 6/30. AFT 0. GDS 0. CALM. CRANIAL NERVE:  pupils equal and reactive to light, visual fields full to confrontation, extraocular muscles intact, no nystagmus, facial sensation and strength symmetric, uvula midline, shoulder shrug symmetric, tongue midline. MOTOR: normal bulk and tone, full strength in the BUE, BLE SENSORY: normal and symmetric to light touch COORDINATION: finger-nose-finger, fine finger movements normal REFLEXES: deep tendon reflexes present and symmetric GAIT/STATION: narrow based gait; DECR ARM SWING; SLOW, CAUTIOUS GAIT. SMALL STEPS.   DIAGNOSTIC DATA (LABS, IMAGING, TESTING) - I reviewed patient records, labs, notes, testing and imaging myself where available.  Lab Results  Component Value Date   WBC 4.9 03/23/2013   HGB 11.7* 03/23/2013   HCT 33.6* 03/23/2013   MCV 107.0* 03/23/2013   PLT 44* 03/23/2013      Component Value Date/Time   NA 143 03/23/2013 0806   NA 139 02/08/2012   K 3.8 03/23/2013 0806   CL 102 03/23/2013 0806   CO2 25 03/23/2013 0806   GLUCOSE 274* 03/23/2013 0806   BUN 18 03/23/2013 0806   BUN 22* 02/08/2012   CREATININE 0.75 03/23/2013 0806   CREATININE 1.0 02/08/2012   CALCIUM 9.5 03/23/2013 0806   PROT 7.5 07/11/2010 2000   ALBUMIN 4.1 07/11/2010 2000   AST 18 02/08/2012   ALT 22 02/08/2012   ALKPHOS 35 02/08/2012   BILITOT 0.4 07/11/2010 2000   GFRNONAA 81* 03/23/2013 0806   GFRAA >90 03/23/2013 0806   Lab Results  Component Value Date   CHOL 236* 02/08/2012   HDL 49 02/08/2012   LDLCALC 124 02/08/2012   TRIG 317* 02/08/2012   Lab Results  Component Value Date   HGBA1C  Value: 6.0 (NOTE) The ADA recommends the following therapeutic goal for glycemic control related to Hgb A1c measurement: Goal of therapy: <6.5 Hgb A1c  Reference: American Diabetes Association: Clinical Practice Recommendations 2010, Diabetes Care, 2010, 33: (Suppl  1). 12/18/2008   No results found for this basename: VITAMINB12   No  results found for this basename: TSH    I reviewed images myself and agree with interpretation.   01/09/09 CT head - nothing acute   ASSESSMENT AND PLAN  73 y.o. year old female here with moderate dementia, intermittent hallucinations and intermittent depression. Now with progressive loss of cognitive and physical  abilities with superimposed decondition.    PLAN: - Continue exelon, Namenda XR and citalopram - encourage regular routine and physical activities as tolerated - no further neuro-diagnostic testing advised, as symptoms are most consistent with disease progression  Return if symptoms worsen or fail to improve.    Penni Bombard, MD 1/44/8185, 6:31 PM Certified in Neurology, Neurophysiology and Neuroimaging  [facility] [address] [place], [place] [address] [phone]

## 2013-09-24 NOTE — Patient Instructions (Signed)
Continue current medications. 

## 2013-10-21 ENCOUNTER — Emergency Department (HOSPITAL_COMMUNITY): Payer: Medicare Other

## 2013-10-21 ENCOUNTER — Inpatient Hospital Stay (HOSPITAL_COMMUNITY)
Admission: EM | Admit: 2013-10-21 | Discharge: 2013-10-29 | DRG: 064 | Disposition: A | Discharge status: Hospice | Payer: Medicare Other | Attending: Internal Medicine | Admitting: Internal Medicine

## 2013-10-21 ENCOUNTER — Encounter (HOSPITAL_COMMUNITY): Payer: Self-pay | Admitting: Emergency Medicine

## 2013-10-21 DIAGNOSIS — D61818 Other pancytopenia: Secondary | ICD-10-CM | POA: Diagnosis present

## 2013-10-21 DIAGNOSIS — M353 Polymyalgia rheumatica: Secondary | ICD-10-CM | POA: Diagnosis present

## 2013-10-21 DIAGNOSIS — R5081 Fever presenting with conditions classified elsewhere: Secondary | ICD-10-CM | POA: Diagnosis present

## 2013-10-21 DIAGNOSIS — D709 Neutropenia, unspecified: Secondary | ICD-10-CM

## 2013-10-21 DIAGNOSIS — R5382 Chronic fatigue, unspecified: Secondary | ICD-10-CM | POA: Diagnosis present

## 2013-10-21 DIAGNOSIS — I671 Cerebral aneurysm, nonruptured: Secondary | ICD-10-CM | POA: Diagnosis present

## 2013-10-21 DIAGNOSIS — Z66 Do not resuscitate: Secondary | ICD-10-CM | POA: Diagnosis present

## 2013-10-21 DIAGNOSIS — Z23 Encounter for immunization: Secondary | ICD-10-CM

## 2013-10-21 DIAGNOSIS — F419 Anxiety disorder, unspecified: Secondary | ICD-10-CM | POA: Diagnosis present

## 2013-10-21 DIAGNOSIS — R1310 Dysphagia, unspecified: Secondary | ICD-10-CM | POA: Diagnosis present

## 2013-10-21 DIAGNOSIS — G9341 Metabolic encephalopathy: Secondary | ICD-10-CM | POA: Diagnosis present

## 2013-10-21 DIAGNOSIS — E870 Hyperosmolality and hypernatremia: Secondary | ICD-10-CM | POA: Diagnosis present

## 2013-10-21 DIAGNOSIS — Z8673 Personal history of transient ischemic attack (TIA), and cerebral infarction without residual deficits: Secondary | ICD-10-CM

## 2013-10-21 DIAGNOSIS — E1165 Type 2 diabetes mellitus with hyperglycemia: Secondary | ICD-10-CM | POA: Diagnosis present

## 2013-10-21 DIAGNOSIS — Z515 Encounter for palliative care: Secondary | ICD-10-CM | POA: Diagnosis not present

## 2013-10-21 DIAGNOSIS — N179 Acute kidney failure, unspecified: Secondary | ICD-10-CM | POA: Diagnosis present

## 2013-10-21 DIAGNOSIS — I609 Nontraumatic subarachnoid hemorrhage, unspecified: Secondary | ICD-10-CM | POA: Diagnosis present

## 2013-10-21 DIAGNOSIS — R627 Adult failure to thrive: Secondary | ICD-10-CM

## 2013-10-21 DIAGNOSIS — F0391 Unspecified dementia with behavioral disturbance: Secondary | ICD-10-CM

## 2013-10-21 DIAGNOSIS — Z79899 Other long term (current) drug therapy: Secondary | ICD-10-CM

## 2013-10-21 DIAGNOSIS — E46 Unspecified protein-calorie malnutrition: Secondary | ICD-10-CM | POA: Diagnosis present

## 2013-10-21 DIAGNOSIS — Z87891 Personal history of nicotine dependence: Secondary | ICD-10-CM

## 2013-10-21 DIAGNOSIS — IMO0002 Reserved for concepts with insufficient information to code with codable children: Secondary | ICD-10-CM

## 2013-10-21 DIAGNOSIS — D72819 Decreased white blood cell count, unspecified: Secondary | ICD-10-CM | POA: Diagnosis present

## 2013-10-21 DIAGNOSIS — D469 Myelodysplastic syndrome, unspecified: Secondary | ICD-10-CM | POA: Diagnosis present

## 2013-10-21 DIAGNOSIS — R0600 Dyspnea, unspecified: Secondary | ICD-10-CM

## 2013-10-21 DIAGNOSIS — M797 Fibromyalgia: Secondary | ICD-10-CM | POA: Diagnosis present

## 2013-10-21 DIAGNOSIS — E876 Hypokalemia: Secondary | ICD-10-CM | POA: Diagnosis not present

## 2013-10-21 DIAGNOSIS — Z6822 Body mass index (BMI) 22.0-22.9, adult: Secondary | ICD-10-CM | POA: Diagnosis not present

## 2013-10-21 DIAGNOSIS — Z7952 Long term (current) use of systemic steroids: Secondary | ICD-10-CM

## 2013-10-21 DIAGNOSIS — Z9841 Cataract extraction status, right eye: Secondary | ICD-10-CM | POA: Diagnosis not present

## 2013-10-21 DIAGNOSIS — F0280 Dementia in other diseases classified elsewhere without behavioral disturbance: Secondary | ICD-10-CM | POA: Diagnosis present

## 2013-10-21 DIAGNOSIS — I639 Cerebral infarction, unspecified: Secondary | ICD-10-CM | POA: Diagnosis present

## 2013-10-21 DIAGNOSIS — F03B18 Unspecified dementia, moderate, with other behavioral disturbance: Secondary | ICD-10-CM

## 2013-10-21 DIAGNOSIS — D696 Thrombocytopenia, unspecified: Secondary | ICD-10-CM | POA: Diagnosis present

## 2013-10-21 DIAGNOSIS — W19XXXA Unspecified fall, initial encounter: Secondary | ICD-10-CM | POA: Diagnosis present

## 2013-10-21 DIAGNOSIS — E860 Dehydration: Secondary | ICD-10-CM | POA: Diagnosis present

## 2013-10-21 DIAGNOSIS — I619 Nontraumatic intracerebral hemorrhage, unspecified: Secondary | ICD-10-CM

## 2013-10-21 DIAGNOSIS — E119 Type 2 diabetes mellitus without complications: Secondary | ICD-10-CM

## 2013-10-21 DIAGNOSIS — Z9842 Cataract extraction status, left eye: Secondary | ICD-10-CM | POA: Diagnosis not present

## 2013-10-21 DIAGNOSIS — R531 Weakness: Secondary | ICD-10-CM

## 2013-10-21 DIAGNOSIS — G309 Alzheimer's disease, unspecified: Secondary | ICD-10-CM | POA: Diagnosis present

## 2013-10-21 DIAGNOSIS — R509 Fever, unspecified: Secondary | ICD-10-CM

## 2013-10-21 DIAGNOSIS — R4182 Altered mental status, unspecified: Secondary | ICD-10-CM

## 2013-10-21 DIAGNOSIS — R739 Hyperglycemia, unspecified: Secondary | ICD-10-CM

## 2013-10-21 DIAGNOSIS — G9340 Encephalopathy, unspecified: Secondary | ICD-10-CM | POA: Diagnosis present

## 2013-10-21 LAB — CBC WITH DIFFERENTIAL/PLATELET
BASOS PCT: 1 % (ref 0–1)
Basophils Absolute: 0 10*3/uL (ref 0.0–0.1)
EOS ABS: 0 10*3/uL (ref 0.0–0.7)
Eosinophils Relative: 0 % (ref 0–5)
HCT: 28.5 % — ABNORMAL LOW (ref 36.0–46.0)
HEMOGLOBIN: 9.1 g/dL — AB (ref 12.0–15.0)
LYMPHS PCT: 43 % (ref 12–46)
Lymphs Abs: 0.3 10*3/uL — ABNORMAL LOW (ref 0.7–4.0)
MCH: 36.4 pg — ABNORMAL HIGH (ref 26.0–34.0)
MCHC: 31.9 g/dL (ref 30.0–36.0)
MCV: 114 fL — ABNORMAL HIGH (ref 78.0–100.0)
MONOS PCT: 2 % — AB (ref 3–12)
Monocytes Absolute: 0 10*3/uL — ABNORMAL LOW (ref 0.1–1.0)
Neutro Abs: 0.4 10*3/uL — ABNORMAL LOW (ref 1.7–7.7)
Neutrophils Relative %: 54 % (ref 43–77)
Platelets: 51 10*3/uL — ABNORMAL LOW (ref 150–400)
RBC: 2.5 MIL/uL — ABNORMAL LOW (ref 3.87–5.11)
RDW: 16.8 % — ABNORMAL HIGH (ref 11.5–15.5)
WBC: 0.7 10*3/uL — AB (ref 4.0–10.5)
nRBC: 2 /100 WBC — ABNORMAL HIGH

## 2013-10-21 LAB — COMPREHENSIVE METABOLIC PANEL
ALBUMIN: 2.9 g/dL — AB (ref 3.5–5.2)
ALT: 110 U/L — ABNORMAL HIGH (ref 0–35)
AST: 119 U/L — AB (ref 0–37)
Alkaline Phosphatase: 77 U/L (ref 39–117)
Anion gap: 14 (ref 5–15)
BUN: 42 mg/dL — ABNORMAL HIGH (ref 6–23)
CALCIUM: 8.6 mg/dL (ref 8.4–10.5)
CHLORIDE: 109 meq/L (ref 96–112)
CO2: 26 mEq/L (ref 19–32)
CREATININE: 1.15 mg/dL — AB (ref 0.50–1.10)
GFR calc Af Amer: 53 mL/min — ABNORMAL LOW (ref >90)
GFR calc non Af Amer: 45 mL/min — ABNORMAL LOW (ref >90)
Glucose, Bld: 441 mg/dL — ABNORMAL HIGH (ref 70–99)
Potassium: 4.6 mEq/L (ref 3.7–5.3)
Sodium: 149 mEq/L — ABNORMAL HIGH (ref 137–147)
Total Bilirubin: 0.5 mg/dL (ref 0.3–1.2)
Total Protein: 7.3 g/dL (ref 6.0–8.3)

## 2013-10-21 LAB — URINALYSIS, ROUTINE W REFLEX MICROSCOPIC
Bilirubin Urine: NEGATIVE
Ketones, ur: 15 mg/dL — AB
LEUKOCYTES UA: NEGATIVE
Nitrite: NEGATIVE
PH: 5.5 (ref 5.0–8.0)
Protein, ur: NEGATIVE mg/dL
Specific Gravity, Urine: 1.025 (ref 1.005–1.030)
Urobilinogen, UA: 0.2 mg/dL (ref 0.0–1.0)

## 2013-10-21 LAB — CBG MONITORING, ED
GLUCOSE-CAPILLARY: 395 mg/dL — AB (ref 70–99)
GLUCOSE-CAPILLARY: 324 mg/dL — AB (ref 70–99)
Glucose-Capillary: 419 mg/dL — ABNORMAL HIGH (ref 70–99)
Glucose-Capillary: 346 mg/dL — ABNORMAL HIGH (ref 70–99)
Glucose-Capillary: 366 mg/dL — ABNORMAL HIGH (ref 70–99)
Glucose-Capillary: 240 mg/dL — ABNORMAL HIGH (ref 70–99)

## 2013-10-21 LAB — URINE MICROSCOPIC-ADD ON

## 2013-10-21 LAB — POC OCCULT BLOOD, ED: FECAL OCCULT BLD: POSITIVE — AB

## 2013-10-21 LAB — I-STAT TROPONIN, ED: Troponin i, poc: 0.03 ng/mL (ref 0.00–0.08)

## 2013-10-21 LAB — I-STAT CG4 LACTIC ACID, ED: Lactic Acid, Venous: 1.96 mmol/L (ref 0.5–2.2)

## 2013-10-21 MED ORDER — SODIUM CHLORIDE 0.9 % IV SOLN
INTRAVENOUS | Status: DC
  Administered 2013-10-21: 19:00:00 via INTRAVENOUS

## 2013-10-21 MED ORDER — IOHEXOL 350 MG/ML SOLN
50.0000 mL | Freq: Once | INTRAVENOUS | Status: AC | PRN
  Administered 2013-10-21: 50 mL via INTRAVENOUS

## 2013-10-21 MED ORDER — ACETAMINOPHEN 650 MG RE SUPP
650.0000 mg | Freq: Once | RECTAL | Status: AC
  Administered 2013-10-21: 650 mg via RECTAL
  Filled 2013-10-21: qty 1

## 2013-10-21 MED ORDER — SODIUM CHLORIDE 0.9 % IJ SOLN: 3.0000 mL | Freq: Two times a day (BID) | INTRAMUSCULAR | Status: DC

## 2013-10-21 MED ORDER — SODIUM CHLORIDE 0.9 % IV SOLN: INTRAVENOUS | Status: DC

## 2013-10-21 MED ORDER — SODIUM CHLORIDE 0.9 % IV SOLN
INTRAVENOUS | Status: DC
  Administered 2013-10-21: 22:00:00 via INTRAVENOUS

## 2013-10-21 MED ORDER — DEXTROSE 50 % IV SOLN: 25.0000 mL | INTRAVENOUS | Status: DC | PRN

## 2013-10-21 MED ORDER — INSULIN REGULAR BOLUS VIA INFUSION: 0.0000 [IU] | Freq: Three times a day (TID) | INTRAVENOUS | Status: DC

## 2013-10-21 MED ORDER — ALBUTEROL SULFATE (2.5 MG/3ML) 0.083% IN NEBU: 2.5000 mg | INHALATION_SOLUTION | RESPIRATORY_TRACT | Status: DC | PRN

## 2013-10-21 MED ORDER — HYDROCORTISONE NA SUCCINATE PF 100 MG IJ SOLR
50.0000 mg | Freq: Three times a day (TID) | INTRAMUSCULAR | Status: DC
  Administered 2013-10-21 – 2013-10-24 (×8): 50 mg via INTRAVENOUS
  Filled 2013-10-21 (×6): qty 1
  Filled 2013-10-21 (×2): qty 2
  Filled 2013-10-21 (×5): qty 1

## 2013-10-21 MED ORDER — DEXTROSE-NACL 5-0.45 % IV SOLN: INTRAVENOUS | Status: DC

## 2013-10-21 MED ORDER — DEXTROSE 5 % IV SOLN
2.0000 g | Freq: Once | INTRAVENOUS | Status: AC
  Administered 2013-10-21: 2 g via INTRAVENOUS
  Filled 2013-10-21: qty 2

## 2013-10-21 MED ORDER — SODIUM CHLORIDE 0.9 % IV BOLUS (SEPSIS)
1000.0000 mL | Freq: Once | INTRAVENOUS | Status: AC
  Administered 2013-10-21: 1000 mL via INTRAVENOUS

## 2013-10-21 MED ORDER — VANCOMYCIN HCL IN DEXTROSE 750-5 MG/150ML-% IV SOLN
750.0000 mg | INTRAVENOUS | Status: DC
  Filled 2013-10-21: qty 150

## 2013-10-21 MED ORDER — SODIUM CHLORIDE 0.9 % IV SOLN
INTRAVENOUS | Status: DC
  Administered 2013-10-21: 2.9 [IU]/h via INTRAVENOUS
  Filled 2013-10-21: qty 2.5

## 2013-10-21 MED ORDER — DEXTROSE 5 % IV SOLN
2.0000 g | INTRAVENOUS | Status: DC
  Filled 2013-10-21: qty 2

## 2013-10-21 MED ORDER — VANCOMYCIN HCL IN DEXTROSE 1-5 GM/200ML-% IV SOLN
1000.0000 mg | Freq: Once | INTRAVENOUS | Status: AC
  Administered 2013-10-21: 1000 mg via INTRAVENOUS
  Filled 2013-10-21: qty 200

## 2013-10-21 NOTE — ED Notes (Signed)
Bow[place], PA at bedside 

## 2013-10-21 NOTE — ED Provider Notes (Signed)
CSN: [id number]     Arrival date & time 10/21/13  1122 History   First MD Initiated Contact with Patient 10/21/13 1123     Chief Complaint  Patient presents with  . Hyperglycemia  . Altered Mental Status     (Consider location/radiation/quality/duration/timing/severity/associated sxs/prior Treatment) HPI  73 year old female with history of dementia, Alzheimer disease, diabetes, fibromyalgia, anxiety, depression, chronic fatigue syndrome who was brought here via EMS from home with altered mental status. History obtained through her husband who is at bedside. Her she states for the past 2 days patient's blood sugar has been high, in the 300s yesterday and in the 500s today. She was her normal self last night but today her nurse aid noticed that she is less responsive, not interactive, and is off her baseline.  Husband agrees that she is usually able to talk.  Husband also notice R arm tremors since yesterday, this is new.  Pt has been eating/drinking at baseline.  No c/o cough, trouble urinating, or trouble breathing.  Husband does not know her code status.  No recent medication changes.  Pt is a non insulin dependent diabetes.  EMS notice strong foul urine odor. Pt received 350cc of NS PTA.  Hx limited due to AMS. Pt has been fallen multiple times within the past few weeks.    Past Medical History  Diagnosis Date  . Diabetes mellitus   . Alzheimer's disease   . Urinary tract infection, site not specified   . Hematuria, unspecified   . Chronic fatigue syndrome   . Alzheimer's dementia   . Colonic dysfunction   . Fibromyalgia   . Anemia   . Polymyalgia rheumatica   . Depression   . Anxiety   . Pancreatic insufficiency   . Unspecified transient cerebral ischemia    Past Surgical History  Procedure Laterality Date  . None     Family History  Problem Relation Age of Onset  . Cancer Mother   . Diabetes Father    History  Substance Use Topics  . Smoking status: Former Smoker     Quit date: 01/02/1982  . Smokeless tobacco: Never Used  . Alcohol Use: No     Comment: quit in 1982   OB History   Grav Para Term Preterm Abortions TAB SAB Ect Mult Living                 Review of Systems  Unable to perform ROS: Dementia      Allergies  Sulfa antibiotics  Home Medications   Prior to Admission medications   Medication Sig Start Date End Date Taking? Authorizing Provider  citalopram (CELEXA) 20 MG tablet Take 20 mg by mouth daily.    Historical Provider, MD  glimepiride (AMARYL) 1 MG tablet Take 1 mg by mouth daily with breakfast.    Historical Provider, MD  loperamide (IMODIUM A-D) 2 MG tablet Take 2 mg by mouth 4 (four) times daily as needed for diarrhea or loose stools.    Historical Provider, MD  Memantine HCl ER (NAMENDA XR) 28 MG CP24 Take 1 capsule by mouth daily.    Historical Provider, MD  Multiple Vitamins-Minerals (WOMENS MULTIVITAMIN PLUS) TABS Take 1 tablet by mouth daily.    Historical Provider, MD  Pancrelipase, Lip-Prot-Amyl, (CREON PO) Take 1 capsule by mouth 3 (three) times daily.     Historical Provider, MD  polyethylene glycol (MIRALAX / GLYCOLAX) packet Take 17 g by mouth daily. Mix in to 8 oz of water and drink  once daily 09/22/13   Audelia Hives Presson, PA  potassium chloride SA (K-DUR,KLOR-CON) 20 MEQ tablet Take 20 mEq by mouth daily.    Historical Provider, MD  predniSONE (DELTASONE) 10 MG tablet Take 15 mg by mouth daily with breakfast.    Historical Provider, MD  rivastigmine (EXELON) 3 MG capsule Take 3 mg by mouth daily. 2 capsules PO in a.m; 1 capsule at night 10/23/12   Penni Bombard, MD  sitaGLIPtan (JANUVIA) 100 MG tablet Take 100 mg by mouth daily.     Historical Provider, MD   There were no vitals taken for this visit. Physical Exam  Nursing note and vitals reviewed. Constitutional:  Cachectic appearing African American female, appears altered, with right arm tremors  HENT:  Head: Normocephalic and atraumatic.   Hematoma noted to R parietal scalp.  Lips are dry  Eyes: Conjunctivae are normal.  Pupils with changes consistent with cataract surgery to both eyes  Neck: Normal range of motion. Neck supple.  Cardiovascular: Exam reveals no gallop and no friction rub.   No murmur heard. tachycardic  Pulmonary/Chest:  Normal respiratory effort, no W/R/R  Abdominal: Soft. There is no tenderness.  Neurological: GCS eye subscore is 3. GCS verbal subscore is 1. GCS motor subscore is 5.  Pt responds to painful stimuli only, does not follow direction, but appears to be able to move all 4 extremities.  Occasional R arm tremors.    Skin:  Multiple hematoma noted throughout body at various stages.      ED Course  Procedures (including critical care time)  11:46 AM Patient with history of dementia who presents for altered mental status. Her last normal according to husband was last night. Difficult to obtain any history from patient. Patient is hyperglycemic with initial CBG of 593, rechecked is in the 400s. She has a core temperature of 101.3, Tylenol suppository given. Value in order however urine appears clear, will check UA, chest x-ray, head CT, with up initiated.  Care discussed with Dr. Rogene Houston.    1:44 PM Head CT demonstrating multi-compartmental acute intracranial hemorrhage with the largest focus in the right sylvian fissure extending superiorly along the sulci in into the right frontal sulci. Additional foci of subarachnoid hemorrhage.  No subdural hemorrhage.  I have consulted with neuro hospitalist Dr. Aram Beecham who agrees to see pt in ER.    3:58 PM i have consulted with [facility], Dr. Waldron Labs, who request neurologist input to determine placement.  I have consulted neurologist Dr. Aram Beecham who request for medicine admission to step down.  Pt will also have a head CTA to assess for ruptured aneurysm.    CRITICAL CARE Performed by: Domenic Moras Total critical care time: 30 min Critical  care time was exclusive of separately billable procedures and treating other patients. Critical care was necessary to treat or prevent imminent or life-threatening deterioration. Critical care was time spent personally by me on the following activities: development of treatment plan with patient and/or surrogate as well as nursing, discussions with consultants, evaluation of patient's response to treatment, examination of patient, obtaining history from patient or surrogate, ordering and performing treatments and interventions, ordering and review of laboratory studies, ordering and review of radiographic studies, pulse oximetry and re-evaluation of patient's condition.    Labs Review Labs Reviewed  COMPREHENSIVE METABOLIC PANEL - Abnormal; Notable for the following:    Sodium 149 (*)    Glucose, Bld 441 (*)    BUN 42 (*)    Creatinine, Ser  1.15 (*)    Albumin 2.9 (*)    AST 119 (*)    ALT 110 (*)    GFR calc non Af Amer 45 (*)    GFR calc Af Amer 53 (*)    All other components within normal limits  URINALYSIS, ROUTINE W REFLEX MICROSCOPIC - Abnormal; Notable for the following:    Glucose, UA >1000 (*)    Hgb urine dipstick MODERATE (*)    Ketones, ur 15 (*)    All other components within normal limits  CBC WITH DIFFERENTIAL - Abnormal; Notable for the following:    WBC 0.7 (*)    RBC 2.50 (*)    Hemoglobin 9.1 (*)    HCT 28.5 (*)    MCV 114.0 (*)    MCH 36.4 (*)    RDW 16.8 (*)    Platelets 51 (*)    Monocytes Relative 2 (*)    nRBC 2 (*)    Neutro Abs 0.4 (*)    Lymphs Abs 0.3 (*)    Monocytes Absolute 0.0 (*)    All other components within normal limits  URINE MICROSCOPIC-ADD ON - Abnormal; Notable for the following:    Squamous Epithelial / LPF FEW (*)    Bacteria, UA FEW (*)    All other components within normal limits  CBG MONITORING, ED - Abnormal; Notable for the following:    Glucose-Capillary 419 (*)    All other components within normal limits  POC OCCULT  BLOOD, ED - Abnormal; Notable for the following:    Fecal Occult Bld POSITIVE (*)    All other components within normal limits  CULTURE, BLOOD (ROUTINE X 2)  CULTURE, BLOOD (ROUTINE X 2)  URINE CULTURE  I-STAT CG4 LACTIC ACID, ED  I-STAT TROPOININ, ED    Imaging Review Dg Chest 2 View  10/21/2013   CLINICAL DATA:  Altered level of consciousness, mental status change ; history of TIAs and diabetes  EXAM: CHEST  2 VIEW  COMPARISON:  PA and lateral chest x-ray of December 18, 2008  FINDINGS: The lungs are borderline hypoinflated. There is no focal infiltrate. There is stable subcentimeter parenchymal density in the right mid lung consistent with scarring. There is minimal bibasilar linear increased density consistent with atelectasis. The cardiac silhouette is normal in size. The pulmonary vascularity is not engorged. The mediastinum is normal in width. There is no pleural effusion. The bony thorax is unremarkable.  IMPRESSION: There is no evidence of pneumonia nor CHF nor other acute cardiopulmonary abnormality. There is minimal bibasilar atelectasis or scarring.   Electronically Signed   By: David  Martinique   On: 10/21/2013 13:25   Ct Head Wo Contrast  10/21/2013   CLINICAL DATA:  73 year old female with hyperglycemia and altered mental status. History of Alzheimer's.  EXAM: CT HEAD WITHOUT CONTRAST  TECHNIQUE: Contiguous axial images were obtained from the base of the skull through the vertex without intravenous contrast.  COMPARISON:  CT 01/09/2009, 12/23/2008  FINDINGS: Unremarkable appearance of the calvarium without acute fracture or aggressive lesion. No scalp swelling.  No significant paranasal sinus disease. Mastoid air cells are clear.  Unremarkable appearance of the orbits with bilateral lens extraction.  Multi compartmental acute hemorrhage, with the predominant focus within the right sylvian fissure and right frontal sulci. There also appears to be a small amount of hemorrhage in the right  petroclival region and posterior to the right sella turcica. Small focus of subarachnoid hemorrhage in the sulci of the left temporal  lobe. There also appears to be a small amount of intraparenchymal hemorrhage versus subarachnoid hemorrhage of the right cerebellar hemisphere.  Hemorrhage layered within the bilateral posterior horns of the lateral ventricles.  Progression of brain volume loss with expansion of the ventricles. Periventricular hypodensity. Intracranial atherosclerosis.  No midline shift.  IMPRESSION: Multi compartmental acute intracranial hemorrhage, with a largest focus in the right sylvian fissure extending superiorly along the sulci and into the right frontal sulci.  Additional foci of subarachnoid hemorrhage include the left temporal sulci, left sylvian fissure, and along the right petroclival ligament. Focus of hemorrhage within the right cerebellar hemisphere may be subarachnoid or intraparenchymal, and there is also blood products layered within the posterior horn the lateral ventricles.  Progression of senescent brain volume loss, periventricular white matter and associated intracranial atherosclerosis.  These results were called by telephone at the time of interpretation on 10/21/2013 at 1:34 pm to Dr. Domenic Moras , who verbally acknowledged these results.  Signed,  Dulcy Fanny. Earleen Newport, DO  [facility]  [facility]   Electronically Signed   By: Corrie Mckusick D.O.   On: 10/21/2013 13:37     EKG Interpretation   Date/Time:  Tuesday October 21 2013 11:34:15 EDT Ventricular Rate:  115 PR Interval:    QRS Duration: 66 QT Interval:  327 QTC Calculation: 452 R Axis:   43 Text Interpretation:  Sinus tachycardia Ventricular premature complex  Aberrant conduction of SV complex(es) Minimal ST depression Confirmed by  ZACKOWSKI  MD, SCOTT ([id number]) on 10/21/2013 11:49:16 AM      MDM   Final diagnoses:  Altered mental status, unspecified  altered mental status type  Intracranial subarachnoid hemorrhage  Hyperglycemia  Neutropenic fever    BP 106/58  Pulse 102  Temp(Src) 100.2 F (37.9 C) (Rectal)  Resp 19  SpO2 100%  I have reviewed nursing notes and vital signs. I personally reviewed the imaging tests through PACS system  I reviewed available ER/hospitalization records thought the EMR\     Domenic Moras, PA-C 10/21/13 1607

## 2013-10-21 NOTE — ED Notes (Signed)
Gertie Fey, PA aware of WBC

## 2013-10-21 NOTE — ED Notes (Signed)
LEVEL 2 CODE SEPSIS INITIATED

## 2013-10-21 NOTE — ED Notes (Signed)
Pt returned from radiology.

## 2013-10-21 NOTE — ED Notes (Signed)
Neurology at bedside.

## 2013-10-21 NOTE — Consult Note (Signed)
NEURO HOSPITALIST CONSULT NOTE    Reason for Consult: altered mental status, abnormal CT brain with acute intracranial hemorrhage   HPI:                                                                                                                                          Jaclyn Hernandez is an 73 y.o. female, right handed, with a past medical history significant for DM, PR, advanced AD, brought to [facility] via EMS from home with altered mental status. Patient is lethargic, has advanced AD and thus can not contribute to her clinical history, and thus all information is gathered from her husband. According to patient's husband, at baseline she is able to speak but now she has experienced a significant change as she has been poorly responsive, unable to engage in conversations, no being herself for the last 3 or 4 days. She got worse today and even had " tremors of the right hand" and he decided to call EMS and bring her to the hospital. Husband said that Jaclyn Hernandez doesn't walk anymore and had her last fall few days ago. Had some fever yesterday. Work up in the ED includes CT brain that revealed " multi compartmental acute intracranial hemorrhage, with a largest focus in the right sylvian fissure extending superiorly along the sulci and into the right frontal sulci.Additional foci of subarachnoid hemorrhage include the left temporal sulci, left sylvian fissure, and along the right petroclival ligament. Focus of hemorrhage within the right cerebellar hemisphere may be subarachnoid or intraparenchymal, and there is also blood products layered within the posterior horn the lateral ventricles". In the ED: Pt cbg 593, temp 101.3 and tachycardic. Foul odor of urine. Na 149, Cr 1.15, ALT 110, AST 119. Platelets 51.  Past Medical History  Diagnosis Date  . Diabetes mellitus   . Alzheimer's disease   . Urinary tract infection, site not specified   . Hematuria, unspecified   . Chronic  fatigue syndrome   . Alzheimer's dementia   . Colonic dysfunction   . Fibromyalgia   . Anemia   . Polymyalgia rheumatica   . Depression   . Anxiety   . Pancreatic insufficiency   . Unspecified transient cerebral ischemia     Past Surgical History  Procedure Laterality Date  . None      Family History  Problem Relation Age of Onset  . Cancer Mother   . Diabetes Father    Social History:  reports that she quit smoking about 31 years ago. She has never used smokeless tobacco. She reports that she does not drink alcohol or use illicit drugs.  Allergies  Allergen Reactions  . Sulfa Antibiotics Other (See Comments)    unknown    MEDICATIONS:  I have reviewed the patient's current medications.   ROS: unable to obtain due to mental status                                                                                                                History obtained from chart review and husband.    Physical exam: lethargic, no apparent distress. Blood pressure 105/76, pulse 102, temperature 100.2 F (37.9 C), temperature source Rectal, resp. rate 27, SpO2 99.00%. Head: normocephalic. Neck: mildly stiff, no bruits, no JVD. Cardiac: no murmurs. Lungs: clear. Abdomen: soft, no tender, no mass. Extremities: no edema.  Neurologic Examination:                                                                                                      Mental status: lethargic, at times open her eyes to verbal commands. Follows some simple commands inconsistently. Says her name. CN 2-12: patient resists eye exam, but pupils seem to be symmetric, EOM appear to be preserved, no nystagmus. Face symmetric. Tongue midline. Motor: moves all limbs spontaneously and symmetrically. Sensory: withdraws to pain. DTR's: 1+ all over. Plantars: left upgoing, right downgoing. Coordination  and gait: unable to test.   Lab Results  Component Value Date/Time   CHOL 236* 02/08/2012    Results for orders placed during the hospital encounter of 10/21/13 (from the past 48 hour(s))  COMPREHENSIVE METABOLIC PANEL     Status: Abnormal   Collection Time    10/21/13 11:31 AM      Result Value Ref Range   Sodium 149 (*) 137 - 147 mEq/L   Potassium 4.6  3.7 - 5.3 mEq/L   Chloride 109  96 - 112 mEq/L   CO2 26  19 - 32 mEq/L   Glucose, Bld 441 (*) 70 - 99 mg/dL   BUN 42 (*) 6 - 23 mg/dL   Creatinine, Ser 1.15 (*) 0.50 - 1.10 mg/dL   Calcium 8.6  8.4 - 10.5 mg/dL   Total Protein 7.3  6.0 - 8.3 g/dL   Albumin 2.9 (*) 3.5 - 5.2 g/dL   AST 119 (*) 0 - 37 U/L   ALT 110 (*) 0 - 35 U/L   Alkaline Phosphatase 77  39 - 117 U/L   Total Bilirubin 0.5  0.3 - 1.2 mg/dL   GFR calc non Af Amer 45 (*) >90 mL/min   GFR calc Af Amer 53 (*) >90 mL/min   Comment: (NOTE)     The eGFR has been calculated using the CKD EPI equation.     This calculation has not been validated in all  clinical situations.     eGFR's persistently <90 mL/min signify possible Chronic Kidney     Disease.   Anion gap 14  5 - 15  CBC WITH DIFFERENTIAL     Status: Abnormal   Collection Time    10/21/13 11:31 AM      Result Value Ref Range   WBC 0.7 (*) 4.0 - 10.5 K/uL   Comment: REPEATED TO VERIFY     CRITICAL RESULT CALLED TO, READ BACK BY AND VERIFIED WITH:     B.FNITH,RN @1303  10/21/13   RBC 2.50 (*) 3.87 - 5.11 MIL/uL   Hemoglobin 9.1 (*) 12.0 - 15.0 g/dL   HCT 28.5 (*) 36.0 - 46.0 %   MCV 114.0 (*) 78.0 - 100.0 fL   MCH 36.4 (*) 26.0 - 34.0 pg   MCHC 31.9  30.0 - 36.0 g/dL   RDW 16.8 (*) 11.5 - 15.5 %   Platelets 51 (*) 150 - 400 K/uL   Comment: REPEATED TO VERIFY     SPECIMEN CHECKED FOR CLOTS     PLATELET COUNT CONFIRMED BY SMEAR   Neutrophils Relative % 54  43 - 77 %   Lymphocytes Relative 43  12 - 46 %   Monocytes Relative 2 (*) 3 - 12 %   Eosinophils Relative 0  0 - 5 %   Basophils Relative 1  0 - 1  %   nRBC 2 (*) 0 /100 WBC   Neutro Abs 0.4 (*) 1.7 - 7.7 K/uL   Lymphs Abs 0.3 (*) 0.7 - 4.0 K/uL   Monocytes Absolute 0.0 (*) 0.1 - 1.0 K/uL   Eosinophils Absolute 0.0  0.0 - 0.7 K/uL   Basophils Absolute 0.0  0.0 - 0.1 K/uL   RBC Morphology RARE NRBCs     Comment: POLYCHROMASIA PRESENT     ELLIPTOCYTES     OVAL MACROCYTES     TEARDROP CELLS     SPHEROCYTES  CBG MONITORING, ED     Status: Abnormal   Collection Time    10/21/13 11:34 AM      Result Value Ref Range   Glucose-Capillary 419 (*) 70 - 99 mg/dL  URINALYSIS, ROUTINE W REFLEX MICROSCOPIC     Status: Abnormal   Collection Time    10/21/13 11:35 AM      Result Value Ref Range   Color, Urine YELLOW  YELLOW   APPearance CLEAR  CLEAR   Specific Gravity, Urine 1.025  1.005 - 1.030   pH 5.5  5.0 - 8.0   Glucose, UA >1000 (*) NEGATIVE mg/dL   Hgb urine dipstick MODERATE (*) NEGATIVE   Bilirubin Urine NEGATIVE  NEGATIVE   Ketones, ur 15 (*) NEGATIVE mg/dL   Protein, ur NEGATIVE  NEGATIVE mg/dL   Urobilinogen, UA 0.2  0.0 - 1.0 mg/dL   Nitrite NEGATIVE  NEGATIVE   Leukocytes, UA NEGATIVE  NEGATIVE  URINE MICROSCOPIC-ADD ON     Status: Abnormal   Collection Time    10/21/13 11:35 AM      Result Value Ref Range   Squamous Epithelial / LPF FEW (*) RARE   WBC, UA 0-2  <3 WBC/hpf   RBC / HPF 0-2  <3 RBC/hpf   Bacteria, UA FEW (*) RARE  I-STAT CG4 LACTIC ACID, ED     Status: None   Collection Time    10/21/13 12:15 PM      Result Value Ref Range   Lactic Acid, Venous 1.96  0.5 - 2.2 mmol/L  POC OCCULT BLOOD, ED     Status: Abnormal   Collection Time    10/21/13  1:45 PM      Result Value Ref Range   Fecal Occult Bld POSITIVE (*) NEGATIVE  I-STAT TROPOININ, ED     Status: None   Collection Time    10/21/13  2:05 PM      Result Value Ref Range   Troponin i, poc 0.03  0.00 - 0.08 ng/mL   Comment 3            Comment: Due to the release kinetics of cTnI,     a negative result within the first hours     of the onset of  symptoms does not rule out     myocardial infarction with certainty.     If myocardial infarction is still suspected,     repeat the test at appropriate intervals.    Dg Chest 2 View  10/21/2013   CLINICAL DATA:  Altered level of consciousness, mental status change ; history of TIAs and diabetes  EXAM: CHEST  2 VIEW  COMPARISON:  PA and lateral chest x-ray of December 18, 2008  FINDINGS: The lungs are borderline hypoinflated. There is no focal infiltrate. There is stable subcentimeter parenchymal density in the right mid lung consistent with scarring. There is minimal bibasilar linear increased density consistent with atelectasis. The cardiac silhouette is normal in size. The pulmonary vascularity is not engorged. The mediastinum is normal in width. There is no pleural effusion. The bony thorax is unremarkable.  IMPRESSION: There is no evidence of pneumonia nor CHF nor other acute cardiopulmonary abnormality. There is minimal bibasilar atelectasis or scarring.   Electronically Signed   By: David  Martinique   On: 10/21/2013 13:25   Ct Head Wo Contrast  10/21/2013   CLINICAL DATA:  73 year old female with hyperglycemia and altered mental status. History of Alzheimer's.  EXAM: CT HEAD WITHOUT CONTRAST  TECHNIQUE: Contiguous axial images were obtained from the base of the skull through the vertex without intravenous contrast.  COMPARISON:  CT 01/09/2009, 12/23/2008  FINDINGS: Unremarkable appearance of the calvarium without acute fracture or aggressive lesion. No scalp swelling.  No significant paranasal sinus disease. Mastoid air cells are clear.  Unremarkable appearance of the orbits with bilateral lens extraction.  Multi compartmental acute hemorrhage, with the predominant focus within the right sylvian fissure and right frontal sulci. There also appears to be a small amount of hemorrhage in the right petroclival region and posterior to the right sella turcica. Small focus of subarachnoid hemorrhage in the  sulci of the left temporal lobe. There also appears to be a small amount of intraparenchymal hemorrhage versus subarachnoid hemorrhage of the right cerebellar hemisphere.  Hemorrhage layered within the bilateral posterior horns of the lateral ventricles.  Progression of brain volume loss with expansion of the ventricles. Periventricular hypodensity. Intracranial atherosclerosis.  No midline shift.  IMPRESSION: Multi compartmental acute intracranial hemorrhage, with a largest focus in the right sylvian fissure extending superiorly along the sulci and into the right frontal sulci.  Additional foci of subarachnoid hemorrhage include the left temporal sulci, left sylvian fissure, and along the right petroclival ligament. Focus of hemorrhage within the right cerebellar hemisphere may be subarachnoid or intraparenchymal, and there is also blood products layered within the posterior horn the lateral ventricles.  Progression of senescent brain volume loss, periventricular white matter and associated intracranial atherosclerosis.  These results were called by telephone at the time of interpretation on 10/21/2013  at 1:34 pm to Dr. Domenic Moras , who verbally acknowledged these results.  Signed,  Dulcy Fanny. Earleen Newport, DO  [facility]  [facility]   Electronically Signed   By: Corrie Mckusick D.O.   On: 10/21/2013 13:37   Assessment/Plan: 73 y/o with advanced dementia brought in due to altered mental status for the last 3 or 4 days, most likely multifactorial. Multifocal SAH: had a recent fall and this can be post traumatic SAH, but apparently she did not sustain head trauma, and the largest focus in the right sylvian fissure extending superiorly along the sulci and into the right frontal sulci. Thus, will suggest CTA to exclude a rupture cerebral aneurysm. Will follow up.  Dorian Pod, MD 10/21/2013, 2:31 PM

## 2013-10-21 NOTE — Progress Notes (Signed)
ANTIBIOTIC CONSULT NOTE - INITIAL  Pharmacy Consult for Cefepime/Vancomycin Indication: Sepsis  Allergies  Allergen Reactions  . Sulfa Antibiotics Other (See Comments)    unknown   Vital Signs: Temp: 99 F (37.2 C) (10/20 1945) Temp Source: Rectal (10/20 1945) BP: 105/86 mmHg (10/20 2000) Pulse Rate: 97 (10/20 2000)  Labs:  Recent Labs  10/21/13 1131  WBC 0.7*  HGB 9.1*  PLT 51*  CREATININE 1.15*   The CrCl is unknown because both a height and weight (above a minimum accepted value) are required for this calculation. No results found for this basename: VANCOTROUGH, VANCOPEAK, VANCORANDOM, GENTTROUGH, GENTPEAK, GENTRANDOM, TOBRATROUGH, TOBRAPEAK, TOBRARND, AMIKACINPEAK, AMIKACINTROU, AMIKACIN,  in the last 72 hours   Microbiology: No results found for this or any previous visit (from the past 720 hour(s)).  Medical History: Past Medical History  Diagnosis Date  . Diabetes mellitus   . Alzheimer's disease   . Urinary tract infection, site not specified   . Hematuria, unspecified   . Chronic fatigue syndrome   . Alzheimer's dementia   . Colonic dysfunction   . Fibromyalgia   . Anemia   . Polymyalgia rheumatica   . Depression   . Anxiety   . Pancreatic insufficiency   . Unspecified transient cerebral ischemia    Assessment: 73 y/o F w/ AMS and hyperglycemia.  Hx of alzheimer's but usually awake and talking. CBG 593, temp 101.3, BP 100, pulse 92-113, WBC 0.7.  Lactic acid WNL.  Empiric antibiotics for sepsis. CrCl 33.2 mL/min.  Blood cx--> Urine cx-->  Goal of Therapy:  Vancomycin trough 15-20  Plan:  Cefepime IV 2g q24hr Vancomycin IV 750mg  q24h F/u vancomycin trough, renal function, cultures and susceptibilities.  Carl Best 10/21/2013,8:16 PM   Note has been reviewed, I agree with the assessment and plan.   Hughes Better, PharmD, BCPS Clinical Pharmacist Pager: [phone] 10/21/2013 8:56 PM

## 2013-10-21 NOTE — ED Notes (Signed)
Patient transported to CT 

## 2013-10-21 NOTE — ED Notes (Signed)
Pt resting at this time with eyes closed.  Family remains at bedside

## 2013-10-21 NOTE — H&P (Addendum)
Patient Demographics  Jaclyn Hernandez, is a 73 y.o. female  MRN: [id number]   DOB - 1940/11/15  Admit Date - 10/21/2013  Outpatient Primary MD for the patient is Marlou Sa, ERIC, MD   With History of -  Past Medical History  Diagnosis Date  . Diabetes mellitus   . Alzheimer's disease   . Urinary tract infection, site not specified   . Hematuria, unspecified   . Chronic fatigue syndrome   . Alzheimer's dementia   . Colonic dysfunction   . Fibromyalgia   . Anemia   . Polymyalgia rheumatica   . Depression   . Anxiety   . Pancreatic insufficiency   . Unspecified transient cerebral ischemia       Past Surgical History  Procedure Laterality Date  . None      in for   Chief Complaint  Patient presents with  . Altered Mental Status     HPI  Jaclyn Hernandez  is a 73 y.o. female, with known past medical history for diabetes mellitus, advanced Alzheimer's dementia, polymyalgia rheumatica, presents with altered mental status, patient has advanced dementia at baseline, brought by her husband as she was more lethargic, has not been able to eat bronchitic over the last 24 hours, patient reports usually patient ambulates with walker with full assistance, the last 2 weeks she became less ambulatory, she Days ago, became less responsive, not eating, upon presentation to ED patient had CT head which did show multi compartmental acute intracranial hemorrhage, with a largest focus in the right sylvian fissure extending superiorly along the sulci and into the right frontal sulci.Additional foci of subarachnoid hemorrhage include the left temporal sulci, left sylvian fissure, and along the right petroclival ligament. Focus of hemorrhage within the right cerebellar hemisphere may be subarachnoid or intraparenchymal, and there is also blood products layered within the posterior horn the lateral ventricles. As well patient was noticed to have elevated blood sugar in the 400s. As well patient was  noticed to have thrombocytopenia which appears to be chronic, as well she has leukopenia with white blood cell count of 0.7. In the ED: Pt cbg 593, temp 101.3 and tachycardic.  Na 149, Cr 1.15, ALT 110, AST 119. Platelets 51.      Review of Systems    Lethargic, minimally responsive, unable to provide any reliable review of system.     Social History History  Substance Use Topics  . Smoking status: Former Smoker    Quit date: 01/02/1982  . Smokeless tobacco: Never Used  . Alcohol Use: No     Comment: quit in 1982     Family History Family History  Problem Relation Age of Onset  . Cancer Mother   . Diabetes Father      Prior to Admission medications   Medication Sig Start Date End Date Taking? Authorizing Provider  citalopram (CELEXA) 20 MG tablet Take 20 mg by mouth daily.   Yes Historical Provider, MD  glimepiride (AMARYL) 1 MG tablet Take 1 mg by mouth daily with breakfast.   Yes Historical Provider, MD  loperamide (IMODIUM A-D) 2 MG tablet Take 2 mg by mouth 4 (four) times daily as needed for diarrhea or loose stools.   Yes Historical Provider, MD  Memantine HCl ER (NAMENDA XR) 28 MG CP24 Take 28 mg by mouth daily.    Yes Historical Provider, MD  Pancrelipase, Lip-Prot-Amyl, (CREON) 24000 UNITS CPEP Take 24,000 Units by mouth 2 (two) times daily.   Yes Historical  Provider, MD  potassium chloride SA (K-DUR,KLOR-CON) 20 MEQ tablet Take 20 mEq by mouth daily.   Yes Historical Provider, MD  predniSONE (DELTASONE) 10 MG tablet Take 15 mg by mouth daily with breakfast.   Yes Historical Provider, MD  rivastigmine (EXELON) 3 MG capsule Take 3 mg by mouth 2 (two) times daily.  10/23/12  Yes Penni Bombard, MD  sitaGLIPtan (JANUVIA) 100 MG tablet Take 100 mg by mouth daily.    Yes Historical Provider, MD    Allergies  Allergen Reactions  . Sulfa Antibiotics Other (See Comments)    unknown    Physical Exam  Vitals  Blood pressure 110/62, pulse 97, temperature 100.2  F (37.9 C), temperature source Rectal, resp. rate 19, SpO2 97.00%.   1. General frail appearing elderly female lying in bed  2. lethargic, troponin I to verbal stimuli, noncommunicative.  3. October moves all extremities spontaneously and symmetrically, withdraws to pain  4. Ears and Eyes appear Normal, Conjunctivae clear, PERRLA. Dry Oral Mucosa.  5. Supple Neck, No JVD, No cervical lymphadenopathy appriciated, No Carotid Bruits.  6. Symmetrical Chest wall movement, fair air movement bilaterally, CTAB.  7. RRR, No Gallops, Rubs or Murmurs, No Parasternal Heave.  8. Positive Bowel Sounds, Abdomen Soft, No tenderness, No organomegaly appriciated,No rebound -guarding or rigidity.  9.  No Cyanosis, Normal Skin Turgor, no typical skin bruises from fall.  10. Poor muscle tone, has muscle wasting,  joints appear normal , no effusions, Normal ROM.  11. No Palpable Lymph Nodes in Neck or Axillae   Data Review  CBC  Recent Labs Lab 10/21/13 1131  WBC 0.7*  HGB 9.1*  HCT 28.5*  PLT 51*  MCV 114.0*  MCH 36.4*  MCHC 31.9  RDW 16.8*  LYMPHSABS 0.3*  MONOABS 0.0*  EOSABS 0.0  BASOSABS 0.0   ------------------------------------------------------------------------------------------------------------------  Chemistries   Recent Labs Lab 10/21/13 1131  NA 149*  K 4.6  CL 109  CO2 26  GLUCOSE 441*  BUN 42*  CREATININE 1.15*  CALCIUM 8.6  AST 119*  ALT 110*  ALKPHOS 77  BILITOT 0.5   ------------------------------------------------------------------------------------------------------------------ CrCl is unknown because both a height and weight (above a minimum accepted value) are required for this calculation. ------------------------------------------------------------------------------------------------------------------ No results found for this basename: TSH, T4TOTAL, FREET3, T3FREE, THYROIDAB,  in the last 72 hours   Coagulation profile No results found  for this basename: INR, PROTIME,  in the last 168 hours ------------------------------------------------------------------------------------------------------------------- No results found for this basename: DDIMER,  in the last 72 hours -------------------------------------------------------------------------------------------------------------------  Cardiac Enzymes No results found for this basename: CK, CKMB, TROPONINI, MYOGLOBIN,  in the last 168 hours ------------------------------------------------------------------------------------------------------------------ No components found with this basename: POCBNP,    ---------------------------------------------------------------------------------------------------------------  Urinalysis    Component Value Date/Time   COLORURINE YELLOW 10/21/2013 1135   APPEARANCEUR CLEAR 10/21/2013 1135   LABSPEC 1.025 10/21/2013 1135   PHURINE 5.5 10/21/2013 1135   GLUCOSEU >1000* 10/21/2013 1135   HGBUR MODERATE* 10/21/2013 1135   BILIRUBINUR NEGATIVE 10/21/2013 1135   KETONESUR 15* 10/21/2013 1135   PROTEINUR NEGATIVE 10/21/2013 1135   UROBILINOGEN 0.2 10/21/2013 1135   NITRITE NEGATIVE 10/21/2013 1135   LEUKOCYTESUR NEGATIVE 10/21/2013 1135    ----------------------------------------------------------------------------------------------------------------  Imaging results:   Ct Angio Head W/cm &/or Wo Cm  10/21/2013   CLINICAL DATA:  Intracerebral hemorrhage.  EXAM: CT ANGIOGRAPHY HEAD  TECHNIQUE: Multidetector CT imaging of the head was performed using the standard protocol during bolus administration of intravenous contrast. Multiplanar CT image  reconstructions and MIPs were obtained to evaluate the vascular anatomy.  CONTRAST:  31mL OMNIPAQUE IOHEXOL 350 MG/ML SOLN  COMPARISON:  Head CT 10/21/2013 and MRA 08/28/2007  FINDINGS: Acute subarachnoid hemorrhage does not appear significantly changed from recent noncontrast head CT, with the  greatest volume of hemorrhage present in the right sylvian fissure extending into right frontoparietal sulci. A small amount of hemorrhage is also again seen in left temporal sulci, as well as at the posterior aspect of the right cerebellum. Small amount of intraventricular hemorrhage is again noted in the occipital horns of the lateral ventricles. No abnormal enhancement is identified. There is moderate cerebral atrophy with moderate chronic small vessel ischemic disease. There is no evidence of acute large territory infarct, mass, or midline shift.  Prior bilateral cataract extraction is noted. Mastoid air cells and visualized paranasal sinuses are clear.  Visualized distal vertebral arteries are patent and codominant. Left PICA origin is patent. Right PICA origin is not clearly identified although it may arise from the right AICA, which is patent and dominant. Basilar artery is patent without stenosis. SCA origins are patent. There are medium sized posterior communicating arteries bilaterally with fetal type origins of the PCAs. Left P1 segment is hypoplastic. Right P1 segment is either severely hypoplastic or absent. PCAs are otherwise unremarkable.  Internal carotid arteries are patent from skullbase to carotid termini without stenosis. Minimal intracranial ICA calcification is noted. There is a 2 x 2 mm posteroinferiorly directed outpouching from the left supraclinoid ICA at the posterior communicating artery origin. ACAs and MCAs are unremarkable. Specifically, no right MCA aneurysm is identified in the region of greatest hemorrhage in the right sylvian fissure.  Review of the MIP images confirms the above findings.  IMPRESSION: 1. 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 This may be incidental given its location relative to the acute subarachnoid hemorrhage. No right MCA aneurysm identified. 2. No major intracranial arterial occlusion or significant stenosis. 3. No significant interval change in  acute subarachnoid hemorrhage and small volume intraventricular hemorrhage.   Electronically Signed   By: Logan Bores   On: 10/21/2013 16:46   Dg Chest 2 View  10/21/2013   CLINICAL DATA:  Altered level of consciousness, mental status change ; history of TIAs and diabetes  EXAM: CHEST  2 VIEW  COMPARISON:  PA and lateral chest x-ray of December 18, 2008  FINDINGS: The lungs are borderline hypoinflated. There is no focal infiltrate. There is stable subcentimeter parenchymal density in the right mid lung consistent with scarring. There is minimal bibasilar linear increased density consistent with atelectasis. The cardiac silhouette is normal in size. The pulmonary vascularity is not engorged. The mediastinum is normal in width. There is no pleural effusion. The bony thorax is unremarkable.  IMPRESSION: There is no evidence of pneumonia nor CHF nor other acute cardiopulmonary abnormality. There is minimal bibasilar atelectasis or scarring.   Electronically Signed   By: David  Martinique   On: 10/21/2013 13:25   Ct Head Wo Contrast  10/21/2013   CLINICAL DATA:  73 year old female with hyperglycemia and altered mental status. History of Alzheimer's.  EXAM: CT HEAD WITHOUT CONTRAST  TECHNIQUE: Contiguous axial images were obtained from the base of the skull through the vertex without intravenous contrast.  COMPARISON:  CT 01/09/2009, 12/23/2008  FINDINGS: Unremarkable appearance of the calvarium without acute fracture or aggressive lesion. No scalp swelling.  No significant paranasal sinus disease. Mastoid air cells are clear.  Unremarkable appearance of the orbits with bilateral  lens extraction.  Multi compartmental acute hemorrhage, with the predominant focus within the right sylvian fissure and right frontal sulci. There also appears to be a small amount of hemorrhage in the right petroclival region and posterior to the right sella turcica. Small focus of subarachnoid hemorrhage in the sulci of the left temporal  lobe. There also appears to be a small amount of intraparenchymal hemorrhage versus subarachnoid hemorrhage of the right cerebellar hemisphere.  Hemorrhage layered within the bilateral posterior horns of the lateral ventricles.  Progression of brain volume loss with expansion of the ventricles. Periventricular hypodensity. Intracranial atherosclerosis.  No midline shift.  IMPRESSION: Multi compartmental acute intracranial hemorrhage, with a largest focus in the right sylvian fissure extending superiorly along the sulci and into the right frontal sulci.  Additional foci of subarachnoid hemorrhage include the left temporal sulci, left sylvian fissure, and along the right petroclival ligament. Focus of hemorrhage within the right cerebellar hemisphere may be subarachnoid or intraparenchymal, and there is also blood products layered within the posterior horn the lateral ventricles.  Progression of senescent brain volume loss, periventricular white matter and associated intracranial atherosclerosis.  These results were called by telephone at the time of interpretation on 10/21/2013 at 1:34 pm to Dr. Domenic Moras , who verbally acknowledged these results.  Signed,  Dulcy Fanny. Earleen Newport, DO  [facility]  [facility]   Electronically Signed   By: Corrie Mckusick D.O.   On: 10/21/2013 13:37        Assessment & Plan  Active Problems:   Hemorrhagic cerebrovascular accident (CVA)   DM (diabetes mellitus)   Polymyalgia rheumatica   Alzheimer's dementia   Acute encephalopathy   Thrombocytopenia   Leukopenia    Hemorrhagic CVA -Avoid anticoagulation -Continue with supportive care -Neurology input appreciated -CTA does not show any active bleeding aneurysm  Acute encephalopathy -Patient has poor baseline mentation secondary to Alzheimer's dementia, but with significant deterioration over the last few days, and this is most likely related to her hemorrhagic CVA, as  well metabolic encephalopathy is a probability giving her significant hyperglycemia and clinical dehydration and hypernatremia.  Hypernatremia -Secondary to volume depletion, continue with aggressive IV fluid resuscitation.  Hyperglycemia -Patient does not have elevated anion gap, she does have ketones in the urine, will be started on IV insulin drip.  Thrombocytopenia -Chronic, at baseline.  Polymyalgia rheumatica - Patient has a steroid dependency as she's been on prednisone for a long time, will start on IV hydrocortisone stress dose.  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 But given the fact she had fever upon presentation, will start her empirically on broad-spectrum antibiotic/  Alzheimer's dementia -Resume home meds if become stable  DVT Prophylaxis SCDs   AM Labs Ordered, also please review Full Orders  Family Communication: Admission, patients condition and plan of care including tests being ordered have been discussed with the husband who indicate understanding and agree with the plan and Code Status.  Code Status DO NOT RESUSCITATE  Likely DC to  Will admit to step down  Condition critical/ overall prognosis is poor.  Time spent in minutes : 75 minutes    ELGERGAWY, DAWOOD M.D on 10/21/2013 at 6:42 PM  Between 7am to 7pm - Pager - [phone]  After 7pm go to [email or web address] - password [email or web address]  And look for the night coverage person covering me after hours  [facility] Office  [phone]   **Disclaimer: This note  may have been dictated with voice recognition software. Similar sounding words can inadvertently be transcribed and this note may contain transcription errors which may not have been corrected upon publication of note.**

## 2013-10-21 NOTE — ED Provider Notes (Addendum)
Medical screening examination/treatment/procedure(s) were conducted as a shared visit with non-physician practitioner(s) and myself.  I personally evaluated the patient during the encounter.   EKG Interpretation   Date/Time:  Tuesday October 21 2013 11:34:15 EDT Ventricular Rate:  115 PR Interval:    QRS Duration: 66 QT Interval:  327 QTC Calculation: 452 R Axis:   43 Text Interpretation:  Sinus tachycardia Ventricular premature complex  Aberrant conduction of SV complex(es) Minimal ST depression Confirmed by  Rogene Houston  MD, Marguetta Windish [phone]) on 10/21/2013 11:49:16 AM      Results for orders placed during the hospital encounter of 10/21/13  COMPREHENSIVE METABOLIC PANEL      Result Value Ref Range   Sodium 149 (*) 137 - 147 mEq/L   Potassium 4.6  3.7 - 5.3 mEq/L   Chloride 109  96 - 112 mEq/L   CO2 26  19 - 32 mEq/L   Glucose, Bld 441 (*) 70 - 99 mg/dL   BUN 42 (*) 6 - 23 mg/dL   Creatinine, Ser 1.15 (*) 0.50 - 1.10 mg/dL   Calcium 8.6  8.4 - 10.5 mg/dL   Total Protein 7.3  6.0 - 8.3 g/dL   Albumin 2.9 (*) 3.5 - 5.2 g/dL   AST 119 (*) 0 - 37 U/L   ALT 110 (*) 0 - 35 U/L   Alkaline Phosphatase 77  39 - 117 U/L   Total Bilirubin 0.5  0.3 - 1.2 mg/dL   GFR calc non Af Amer 45 (*) >90 mL/min   GFR calc Af Amer 53 (*) >90 mL/min   Anion gap 14  5 - 15  URINALYSIS, ROUTINE W REFLEX MICROSCOPIC      Result Value Ref Range   Color, Urine YELLOW  YELLOW   APPearance CLEAR  CLEAR   Specific Gravity, Urine 1.025  1.005 - 1.030   pH 5.5  5.0 - 8.0   Glucose, UA >1000 (*) NEGATIVE mg/dL   Hgb urine dipstick MODERATE (*) NEGATIVE   Bilirubin Urine NEGATIVE  NEGATIVE   Ketones, ur 15 (*) NEGATIVE mg/dL   Protein, ur NEGATIVE  NEGATIVE mg/dL   Urobilinogen, UA 0.2  0.0 - 1.0 mg/dL   Nitrite NEGATIVE  NEGATIVE   Leukocytes, UA NEGATIVE  NEGATIVE  CBC WITH DIFFERENTIAL      Result Value Ref Range   WBC 0.7 (*) 4.0 - 10.5 K/uL   RBC 2.50 (*) 3.87 - 5.11 MIL/uL   Hemoglobin 9.1 (*)  12.0 - 15.0 g/dL   HCT 28.5 (*) 36.0 - 46.0 %   MCV 114.0 (*) 78.0 - 100.0 fL   MCH 36.4 (*) 26.0 - 34.0 pg   MCHC 31.9  30.0 - 36.0 g/dL   RDW 16.8 (*) 11.5 - 15.5 %   Platelets 51 (*) 150 - 400 K/uL   Neutrophils Relative % 54  43 - 77 %   Lymphocytes Relative 43  12 - 46 %   Monocytes Relative 2 (*) 3 - 12 %   Eosinophils Relative 0  0 - 5 %   Basophils Relative 1  0 - 1 %   nRBC 2 (*) 0 /100 WBC   Neutro Abs 0.4 (*) 1.7 - 7.7 K/uL   Lymphs Abs 0.3 (*) 0.7 - 4.0 K/uL   Monocytes Absolute 0.0 (*) 0.1 - 1.0 K/uL   Eosinophils Absolute 0.0  0.0 - 0.7 K/uL   Basophils Absolute 0.0  0.0 - 0.1 K/uL   RBC Morphology RARE NRBCs  URINE MICROSCOPIC-ADD ON      Result Value Ref Range   Squamous Epithelial / LPF FEW (*) RARE   WBC, UA 0-2  <3 WBC/hpf   RBC / HPF 0-2  <3 RBC/hpf   Bacteria, UA FEW (*) RARE  CBG MONITORING, ED      Result Value Ref Range   Glucose-Capillary 419 (*) 70 - 99 mg/dL  I-STAT CG4 LACTIC ACID, ED      Result Value Ref Range   Lactic Acid, Venous 1.96  0.5 - 2.2 mmol/L  POC OCCULT BLOOD, ED      Result Value Ref Range   Fecal Occult Bld POSITIVE (*) NEGATIVE  I-STAT TROPOININ, ED      Result Value Ref Range   Troponin i, poc 0.03  0.00 - 0.08 ng/mL   Comment 3            Dg Chest 2 View  10/21/2013   CLINICAL DATA:  Altered level of consciousness, mental status change ; history of TIAs and diabetes  EXAM: CHEST  2 VIEW  COMPARISON:  PA and lateral chest x-ray of December 18, 2008  FINDINGS: The lungs are borderline hypoinflated. There is no focal infiltrate. There is stable subcentimeter parenchymal density in the right mid lung consistent with scarring. There is minimal bibasilar linear increased density consistent with atelectasis. The cardiac silhouette is normal in size. The pulmonary vascularity is not engorged. The mediastinum is normal in width. There is no pleural effusion. The bony thorax is unremarkable.  IMPRESSION: There is no evidence of  pneumonia nor CHF nor other acute cardiopulmonary abnormality. There is minimal bibasilar atelectasis or scarring.   Electronically Signed   By: David  Martinique   On: 10/21/2013 13:25   Ct Head Wo Contrast  10/21/2013   CLINICAL DATA:  72 year old female with hyperglycemia and altered mental status. History of Alzheimer's.  EXAM: CT HEAD WITHOUT CONTRAST  TECHNIQUE: Contiguous axial images were obtained from the base of the skull through the vertex without intravenous contrast.  COMPARISON:  CT 01/09/2009, 12/23/2008  FINDINGS: Unremarkable appearance of the calvarium without acute fracture or aggressive lesion. No scalp swelling.  No significant paranasal sinus disease. Mastoid air cells are clear.  Unremarkable appearance of the orbits with bilateral lens extraction.  Multi compartmental acute hemorrhage, with the predominant focus within the right sylvian fissure and right frontal sulci. There also appears to be a small amount of hemorrhage in the right petroclival region and posterior to the right sella turcica. Small focus of subarachnoid hemorrhage in the sulci of the left temporal lobe. There also appears to be a small amount of intraparenchymal hemorrhage versus subarachnoid hemorrhage of the right cerebellar hemisphere.  Hemorrhage layered within the bilateral posterior horns of the lateral ventricles.  Progression of brain volume loss with expansion of the ventricles. Periventricular hypodensity. Intracranial atherosclerosis.  No midline shift.  IMPRESSION: Multi compartmental acute intracranial hemorrhage, with a largest focus in the right sylvian fissure extending superiorly along the sulci and into the right frontal sulci.  Additional foci of subarachnoid hemorrhage include the left temporal sulci, left sylvian fissure, and along the right petroclival ligament. Focus of hemorrhage within the right cerebellar hemisphere may be subarachnoid or intraparenchymal, and there is also blood products layered  within the posterior horn the lateral ventricles.  Progression of senescent brain volume loss, periventricular white matter and associated intracranial atherosclerosis.  These results were called by telephone at the time of interpretation on 10/21/2013 at 1:34 pm  to Dr. Domenic Moras , who verbally acknowledged these results.  Signed,  Dulcy Fanny. Earleen Newport, DO  [facility]  [facility]   Electronically Signed   By: Corrie Mckusick D.O.   On: 10/21/2013 13:37    Patient seen by me. Patient arrived for altered mental status. Patient does have a history of Alzheimer's disease however the patient's spouse states that for the past few days she has been having more difficulty walking had to use a walker and this morning was very confused much more so than usual. Said several falls over the past few weeks.  Patient Glasgow Coma Scale was 10 or 11. Patient would verbalize some. None good the following commands. Patient had multiple bruises some old some fresh over the body. Also on the right side of the scalp had the area of swelling that perhaps could be old. CT scan showed subarachnoid intercerebral bleed. This was discussed with neural hospitalist. As well as neurosurgery. Patient will require admission. Patient also had heme positive stool. Patient has an anemia. Patient also has marked a leukocytosis. Blood cultures done. He reports history of fever. But no fever here. Patient will be a medical admission with consultation by neurosurgery and neurology.    Fredia Sorrow, MD 10/21/13 1623   addendum: Patient does not require intubation at this point in time. Will require close monitoring.  Fredia Sorrow, MD 10/21/13 [phone]

## 2013-10-21 NOTE — ED Notes (Signed)
Hospitalist at bedside 

## 2013-10-21 NOTE — ED Notes (Signed)
MD at bedside. 

## 2013-10-21 NOTE — ED Notes (Signed)
Per ems- pt from home with aloc. Hx of alzheimer's but normally awake and talking. Pt cbg 593, temp 101.3 and tachycardic. Foul odor of urine. ems administered 350cc NaCl pta. Pt responds to pain/touch.

## 2013-10-22 ENCOUNTER — Encounter (HOSPITAL_COMMUNITY): Payer: Self-pay | Admitting: *Deleted

## 2013-10-22 ENCOUNTER — Inpatient Hospital Stay (HOSPITAL_COMMUNITY): Payer: Medicare Other

## 2013-10-22 DIAGNOSIS — E860 Dehydration: Secondary | ICD-10-CM | POA: Diagnosis present

## 2013-10-22 LAB — URINE CULTURE
COLONY COUNT: NO GROWTH
CULTURE: NO GROWTH

## 2013-10-22 LAB — BASIC METABOLIC PANEL
Anion gap: 12 (ref 5–15)
Anion gap: 15 (ref 5–15)
BUN: 29 mg/dL — AB (ref 6–23)
BUN: 28 mg/dL — ABNORMAL HIGH (ref 6–23)
CALCIUM: 8.4 mg/dL (ref 8.4–10.5)
CO2: 25 mEq/L (ref 19–32)
CO2: 24 mEq/L (ref 19–32)
CREATININE: 0.68 mg/dL (ref 0.50–1.10)
CREATININE: 0.64 mg/dL (ref 0.50–1.10)
Calcium: 8.5 mg/dL (ref 8.4–10.5)
Chloride: 121 mEq/L — ABNORMAL HIGH (ref 96–112)
Chloride: 117 mEq/L — ABNORMAL HIGH (ref 96–112)
GFR calc non Af Amer: 85 mL/min — ABNORMAL LOW (ref >90)
GFR, EST NON AFRICAN AMERICAN: 83 mL/min — AB (ref >90)
GLUCOSE: 136 mg/dL — AB (ref 70–99)
Glucose, Bld: 175 mg/dL — ABNORMAL HIGH (ref 70–99)
Potassium: 3.9 mEq/L (ref 3.7–5.3)
Potassium: 4.1 mEq/L (ref 3.7–5.3)
Sodium: 158 mEq/L — ABNORMAL HIGH (ref 137–147)
Sodium: 156 mEq/L — ABNORMAL HIGH (ref 137–147)

## 2013-10-22 LAB — LACTIC ACID, PLASMA: Lactic Acid, Venous: 3.7 mmol/L — ABNORMAL HIGH (ref 0.5–2.2)

## 2013-10-22 LAB — GLUCOSE, CAPILLARY
GLUCOSE-CAPILLARY: 179 mg/dL — AB (ref 70–99)
GLUCOSE-CAPILLARY: 175 mg/dL — AB (ref 70–99)
Glucose-Capillary: 292 mg/dL — ABNORMAL HIGH (ref 70–99)

## 2013-10-22 LAB — CBC
HCT: 25.7 % — ABNORMAL LOW (ref 36.0–46.0)
Hemoglobin: 8.4 g/dL — ABNORMAL LOW (ref 12.0–15.0)
MCH: 37.7 pg — ABNORMAL HIGH (ref 26.0–34.0)
MCHC: 32.7 g/dL (ref 30.0–36.0)
MCV: 115.2 fL — AB (ref 78.0–100.0)
PLATELETS: 39 10*3/uL — AB (ref 150–400)
RBC: 2.23 MIL/uL — AB (ref 3.87–5.11)
RDW: 17.2 % — ABNORMAL HIGH (ref 11.5–15.5)
WBC: 0.9 10*3/uL — CL (ref 4.0–10.5)

## 2013-10-22 LAB — MRSA PCR SCREENING: MRSA by PCR: NEGATIVE

## 2013-10-22 LAB — CBG MONITORING, ED
GLUCOSE-CAPILLARY: 112 mg/dL — AB (ref 70–99)
GLUCOSE-CAPILLARY: 106 mg/dL — AB (ref 70–99)
Glucose-Capillary: 193 mg/dL — ABNORMAL HIGH (ref 70–99)
Glucose-Capillary: 195 mg/dL — ABNORMAL HIGH (ref 70–99)
Glucose-Capillary: 200 mg/dL — ABNORMAL HIGH (ref 70–99)

## 2013-10-22 LAB — TECHNOLOGIST SMEAR REVIEW

## 2013-10-22 LAB — PROCALCITONIN: Procalcitonin: 1.18 ng/mL

## 2013-10-22 LAB — ABO/RH: ABO/RH(D): O POS

## 2013-10-22 MED ORDER — INSULIN ASPART 100 UNIT/ML ~~LOC~~ SOLN
0.0000 [IU] | SUBCUTANEOUS | Status: DC
  Administered 2013-10-22 (×2): 2 [IU] via SUBCUTANEOUS

## 2013-10-22 MED ORDER — SODIUM CHLORIDE 0.45 % IV SOLN
INTRAVENOUS | Status: DC
  Administered 2013-10-22: 125 mL via INTRAVENOUS
  Administered 2013-10-22: 75 mL/h via INTRAVENOUS
  Administered 2013-10-23: 21:00:00 via INTRAVENOUS
  Administered 2013-10-24: 10 mL via INTRAVENOUS

## 2013-10-22 MED ORDER — CITALOPRAM HYDROBROMIDE 10 MG PO TABS
20.0000 mg | ORAL_TABLET | Freq: Every day | ORAL | Status: DC
  Administered 2013-10-22 – 2013-10-24 (×3): 20 mg via ORAL
  Filled 2013-10-22 (×3): qty 1

## 2013-10-22 MED ORDER — SODIUM CHLORIDE 0.9 % IV SOLN
Freq: Once | INTRAVENOUS | Status: AC
Start: 2013-10-22 — End: 2013-10-24
  Administered 2013-10-22: 15:00:00 via INTRAVENOUS

## 2013-10-22 MED ORDER — HYDROCORTISONE NA SUCCINATE PF 100 MG IJ SOLR: 50.0000 mg | Freq: Three times a day (TID) | INTRAMUSCULAR | Status: DC

## 2013-10-22 MED ORDER — CHLORHEXIDINE GLUCONATE 0.12 % MT SOLN
15.0000 mL | Freq: Two times a day (BID) | OROMUCOSAL | Status: DC
  Administered 2013-10-22 – 2013-10-29 (×14): 15 mL via OROMUCOSAL
  Filled 2013-10-22 (×13): qty 15

## 2013-10-22 MED ORDER — DEXTROSE 50 % IV SOLN: 25.0000 mL | Freq: Once | INTRAVENOUS | Status: DC | PRN

## 2013-10-22 MED ORDER — INSULIN ASPART 100 UNIT/ML ~~LOC~~ SOLN: 0.0000 [IU] | Freq: Three times a day (TID) | SUBCUTANEOUS | Status: DC

## 2013-10-22 MED ORDER — ACETAMINOPHEN 650 MG RE SUPP
650.0000 mg | Freq: Four times a day (QID) | RECTAL | Status: DC | PRN
  Administered 2013-10-27 – 2013-10-29 (×3): 650 mg via RECTAL
  Filled 2013-10-22 (×3): qty 1

## 2013-10-22 MED ORDER — CETYLPYRIDINIUM CHLORIDE 0.05 % MT LIQD
7.0000 mL | Freq: Two times a day (BID) | OROMUCOSAL | Status: DC
  Administered 2013-10-22 – 2013-10-29 (×13): 7 mL via OROMUCOSAL

## 2013-10-22 MED ORDER — RIVASTIGMINE TARTRATE 3 MG PO CAPS
3.0000 mg | ORAL_CAPSULE | Freq: Two times a day (BID) | ORAL | Status: DC
  Administered 2013-10-22 – 2013-10-24 (×5): 3 mg via ORAL
  Filled 2013-10-22 (×16): qty 1

## 2013-10-22 MED ORDER — MEMANTINE HCL ER 28 MG PO CP24
28.0000 mg | ORAL_CAPSULE | Freq: Every day | ORAL | Status: DC
  Administered 2013-10-22 – 2013-10-24 (×3): 28 mg via ORAL
  Filled 2013-10-22 (×8): qty 28

## 2013-10-22 MED ORDER — INSULIN ASPART 100 UNIT/ML ~~LOC~~ SOLN
0.0000 [IU] | Freq: Three times a day (TID) | SUBCUTANEOUS | Status: DC
  Administered 2013-10-22: 2 [IU] via SUBCUTANEOUS
  Administered 2013-10-23 (×2): 5 [IU] via SUBCUTANEOUS

## 2013-10-22 MED ORDER — ACETAMINOPHEN 500 MG PO TABS: 500.0000 mg | ORAL_TABLET | Freq: Four times a day (QID) | ORAL | Status: DC | PRN

## 2013-10-22 MED ORDER — DEXTROSE 50 % IV SOLN: 50.0000 mL | Freq: Once | INTRAVENOUS | Status: DC | PRN

## 2013-10-22 NOTE — Progress Notes (Signed)
Utilization review completed.  

## 2013-10-22 NOTE — Progress Notes (Addendum)
RN paged secondary to pt's CBG 112. Pt is and will continue to be NPO secondary to neuro issues. Anion gap was not open on admission and CO2 was over 20. Therefore, will place pt on q4 hr CBG and SSI only. BMP pending.  Update: Na 158 on BMP. Changed IVF to 1/2 NS. Anion gap 12. Recheck BMP at noon.  KJKG, NP [facility]

## 2013-10-22 NOTE — Progress Notes (Addendum)
[facility]  Jaclyn Hernandez KGM:[id number] DOB: May 05, 1940 DOA: 10/21/2013 PCP: Kevan Ny, MD   Brief narrative: 73 year old female patient who lives at home with her husband. She has known diabetes and advanced Alzheimer disease as well as polymyalgia rheumatica. She was found to have progressive altered mentation over the past 24 hours as evidenced by lethargy and inability to eat. At baseline she in place with a walker. For the past 2 weeks the family has noted the patient becoming less ambulatory. Over the past 24 hours less responsive and not eating.  Upon arrival to the ER patient underwent a CT of the head which showed an multi-compartmental acute subarachnoid hemorrhages. Neurology was consulted. She was also found to have an elevated blood sugar of 400. Patient was noted to be pancytopenic. She has had chronic thrombocytopenia since 2012 but the leukopenia was new. Her white blood cell count was 700 with a low absolute neutrophil count. In the ER the patient was also noted to be febrile with a temperature of 101.3 and tachycardic. Her sodium was elevated at 149 with elevated ALT and AST as well as mildly elevated creatinine are concerning for volume depletion.  Since admission she has been evaluated by the neurologist. 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 No right MCA aneurysm identified. Her sodium has further increased to 158. Her platelets have dropped to 39,000 and given the acute intracranial bleeding the neurologist has requested that platelets be given. Unfortunately this morning she is alert and has appeared to return to her baseline mentation according to the family.  HPI/Subjective: Patient is alert but confused. With prompting she is able to follow simple commands. Responds best to  her family who are at the bedside. Has started to sing.  Assessment/Plan:  Acute Subarachnoid hemorrhage Appreciate neurology assistance-has tiny left PCA aneurysm which according to her neurology is not the etiology for the patient's subarachnoid hemorrhage-family has reported to neurologist recent fall and subarachnoid could be posttraumatic-repeating brain CT in f/u   Acute metabolic encephalopathy Appears to be resolved based on family's report of patient near baseline mentation  Pancytopenia Patient has had low platelets since 2012 with a platelet count at that time of 124,000-in 2013 platelets were down to 60,000-in March of this year platelets 44,000-at presentation platelets were 51,000 and today 39,000 so plan on one unit of platelets-in abscence of clinical evidence to support infection at this time will stop abx and follow clincally-technologist smear review ordered-mild transaminitis without elevated total bilirubin so doubt hemolysis-concerned patient may have evolving myelodysplastic syndrome - follow counts for now - consider further eval as pt stabilizes  Hypernatremia /Dehydration with mild acute renal failure Since appears to be related to volume depletion and patient is diabetic we'll begin with normal saline IV fluids -follow electrolyte -Lactic acid at presentation was 1.96 and since admission has climbed to 3.7 suspect volume depletion as well as hypoperfusion from hypotension -begin IV fluids at 125 cc per hour  Diabetes mellitus Was on Amaryl and Januvia pre-admission-CBGs have been consistently greater than 150 since admission-continue sliding scale insulin-check hemoglobin A1c  Polymyalgia rheumatica on chronic prednisone Blood pressure soft  -continue stress dose steroids: Solu-Cortef 50 mg IV Q8 hours  Advanced Alzheimer's dementia On Celexa, Namenda and Exelon at home-with acute encephalopathy resolved consider resuming these  medications  DVT prophylaxis:  SCDs Code Status: DO NOT RESUSCITATE Family Communication: Husband and children at bedside Disposition Plan/Expected LOS: Stepdown   Consultants: Neurology  Procedures: None  Cultures: Urine culture-no growth Blood cultures pending  Antibiotics: Cefepime 10/20 >> Vancomycin 10/20 >>  Objective: Blood pressure 98/65, pulse 85, temperature 98.1 F (36.7 C), temperature source Oral, resp. rate 17, height 5' 1.81" (1.57 m), weight 110 lb 10.7 oz (50.2 kg), SpO2 95.00%. No intake or output data in the 24 hours ending 10/22/13 1521   Exam: Gen: No acute respiratory distress-alert and confused and follows commands with some prompting Chest: Clear to auscultation bilaterally without wheezes, rhonchi or crackles, room air Cardiac: Regular rate and rhythm, S1-S2, no rubs murmurs or gallops, no peripheral edema, no JVD Abdomen: Soft nontender nondistended without obvious hepatosplenomegaly, no ascites Extremities: Symmetrical in appearance without cyanosis, clubbing or effusion  Scheduled Meds:  Scheduled Meds: . antiseptic oral rinse  7 mL Mouth Rinse q12n4p  . ceFEPime (MAXIPIME) IV  2 g Intravenous Q24H  . chlorhexidine  15 mL Mouth Rinse BID  . hydrocortisone sod succinate (SOLU-CORTEF) inj  50 mg Intravenous Q8H  . insulin aspart  0-9 Units Subcutaneous Q4H  . sodium chloride  3 mL Intravenous Q12H  . vancomycin  750 mg Intravenous Q24H    Data Reviewed: Basic Metabolic Panel:  Recent Labs Lab 10/21/13 1131 10/22/13 0202 10/22/13 1324  NA 149* 158* 156*  K 4.6 3.9 4.1  CL 109 121* 117*  CO2 26 25 24   GLUCOSE 441* 136* 175*  BUN 42* 29* 28*  CREATININE 1.15* 0.68 0.64  CALCIUM 8.6 8.4 8.5   Liver Function Tests:  Recent Labs Lab 10/21/13 1131  AST 119*  ALT 110*  ALKPHOS 77  BILITOT 0.5  PROT 7.3  ALBUMIN 2.9*   CBC:  Recent Labs Lab 10/21/13 1131 10/22/13 0202  WBC 0.7* 0.9*  NEUTROABS 0.4*  --   HGB 9.1* 8.4*  HCT 28.5* 25.7*  MCV 114.0*  115.2*  PLT 51* 39*   CBG:  Recent Labs Lab 10/22/13 0159 10/22/13 0329 10/22/13 0619 10/22/13 0736 10/22/13 1151  GLUCAP 112* 106* 193* 200* 179*    Recent Results (from the past 240 hour(s))  URINE CULTURE     Status: None   Collection Time    10/21/13 11:35 AM      Result Value Ref Range Status   Specimen Description URINE, RANDOM   Final   Special Requests ADDED [id number]   Final   Culture  Setup Time     Final   Value: 10/21/2013 17:42     Performed at [facility]     Final   Value: NO GROWTH     Performed at [facility]   Culture     Final   Value: NO GROWTH     Performed at [facility]   Report Status 10/22/2013 FINAL   Final  CULTURE, BLOOD (ROUTINE X 2)     Status: None   Collection Time    10/21/13 12:00 PM      Result Value Ref Range Status   Specimen Description BLOOD RIGHT ANTECUBITAL   Final   Special Requests BOTTLES DRAWN AEROBIC AND ANAEROBIC 5CC   Final   Culture  Setup Time     Final   Value: 10/21/2013 17:33     Performed at [place]     Final  Value:        BLOOD CULTURE RECEIVED NO GROWTH TO DATE CULTURE WILL BE HELD FOR 5 DAYS BEFORE ISSUING A FINAL NEGATIVE REPORT     Performed at [facility]   Report Status PENDING   Incomplete  CULTURE, BLOOD (ROUTINE X 2)     Status: None   Collection Time    10/21/13 12:08 PM      Result Value Ref Range Status   Specimen Description BLOOD HAND RIGHT   Final   Special Requests BOTTLES DRAWN AEROBIC ONLY 5CC   Final   Culture  Setup Time     Final   Value: 10/21/2013 17:33     Performed at [facility]   Culture     Final   Value:        BLOOD CULTURE RECEIVED NO GROWTH TO DATE CULTURE WILL BE HELD FOR 5 DAYS BEFORE ISSUING A FINAL NEGATIVE REPORT     Performed at [facility]   Report Status PENDING   Incomplete  MRSA PCR SCREENING     Status: None   Collection Time    10/22/13 10:12 AM      Result Value  Ref Range Status   MRSA by PCR NEGATIVE  NEGATIVE Final   Comment:            The GeneXpert MRSA Assay (FDA     approved for NASAL specimens     only), is one component of a     comprehensive MRSA colonization     surveillance program. It is not     intended to diagnose MRSA     infection nor to guide or     monitor treatment for     MRSA infections.     Studies:  Recent x-ray studies have been reviewed in detail by the Attending Physician  Time spent :  [place], ANP [facility] Office  [phone] Pager [phone]   **If unable to reach the above provider after paging please contact the [place] @ [phone]  On-Call/Text Page:      [email or web address]      password [email or web address]  If 7PM-7AM, please contact night-coverage [email or web address] Password [email or web address] 10/22/2013, 3:21 PM   LOS: 1 day   I have personally examined this patient and reviewed the entire database. I have reviewed the above note, made any necessary editorial changes, and agree with its content.  Cherene Altes, MD [facility]

## 2013-10-22 NOTE — Evaluation (Signed)
Clinical/Bedside Swallow Evaluation Patient Details  Name: Jaclyn Hernandez MRN: [id number] Date of Birth: 1940-09-07  Today's Date: 10/22/2013 Time: 8889-1694 SLP Time Calculation (min): 19 min  Past Medical History:  Past Medical History  Diagnosis Date  . Diabetes mellitus   . Alzheimer's disease   . Urinary tract infection, site not specified   . Hematuria, unspecified   . Chronic fatigue syndrome   . Alzheimer's dementia   . Colonic dysfunction   . Fibromyalgia   . Anemia   . Polymyalgia rheumatica   . Depression   . Anxiety   . Pancreatic insufficiency   . Unspecified transient cerebral ischemia    Past Surgical History:  Past Surgical History  Procedure Laterality Date  . None     HPI:  73 y/o with advanced dementia brought in due to altered mental status for the last 3 or 4 days, most likely multifactorial (metabolic, SAH).  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 She had a recent fall and this could be post traumatic SAH.  No hx of prior swallow evals in medical records.  Pt lived at home with family PTA.    Assessment / Plan / Recommendation Clinical Impression  Pt alert, smiling, communicative - presents with functional oropharyngeal swallow with prolonged but effective mastication, consistent swallow response, and no overt s/s of aspiration. Recommend resuming home diet - mechanical soft, thin liquids, chopped meats.  No SLP f/u warranted.  Husband present/agrees.       Aspiration Risk  Mild    Diet Recommendation Dysphagia 3 (Mechanical Soft);Thin liquid (chopoped meats)   Liquid Administration via: Cup Medication Administration: Whole meds with liquid Supervision: Patient able to self feed;Staff to assist with self feeding    Other  Recommendations Oral Care Recommendations: Oral care BID   Follow Up Recommendations  None    Swallow  Study Prior Functional Status       General Date of Onset: 10/21/13 HPI: 73 y/o with advanced dementia brought in due to altered mental status for the last 3 or 4 days, most likely multifactorial (metabolic, SAH).  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 She had a recent fall and this could be post traumatic SAH.  No hx of prior swallow evals in medical records.  Pt lived at home with family PTA.  Type of Study: Bedside swallow evaluation Previous Swallow Assessment: none per records Diet Prior to this Study: NPO Temperature Spikes Noted: No Respiratory Status: Room air History of Recent Intubation: No Behavior/Cognition: Alert;Cooperative;Pleasant mood Oral Cavity - Dentition: Adequate natural dentition Self-Feeding Abilities: Able to feed self;Needs set up Patient Positioning: Upright in bed Baseline Vocal Quality: Clear Volitional Cough: Strong Volitional Swallow: Unable to elicit    Oral/Motor/Sensory Function Overall Oral Motor/Sensory Function: Appears within functional limits for tasks assessed   Ice Chips Ice chips: Within functional limits   Thin Liquid Thin Liquid: Within functional limits Presentation: Cup;Self Fed    Nectar Thick Nectar Thick Liquid: Not tested   Honey Thick Honey Thick Liquid: Not tested   Puree Puree: Within functional limits Presentation: [place]. Richad Ramsay, [place] CCC/SLP Pager [phone]     Solid: Within functional limits       Atari Novick,  Estill Bamberg Laurice 10/22/2013,3:09 PM

## 2013-10-22 NOTE — Progress Notes (Signed)
PT Cancellation Note  Patient Details Name: Jaclyn Hernandez MRN: [id number] DOB: 10-14-1940   Cancelled Treatment:    Reason Eval/Treat Not Completed: Patient not medically ready, active bedrest orders at this time.   Duncan Dull 10/22/2013, 1:40 PM Alben Deeds, [place] DPT  [phone]

## 2013-10-22 NOTE — Progress Notes (Signed)
NEURO HOSPITALIST PROGRESS NOTE   SUBJECTIVE:                                                                                                                        More awake today but remains confused. 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 This may be incidental given its location relative to the acute subarachnoid hemorrhage. No right MCA aneurysm identified. Platelets 39, Na 158, Hemoglobin 8.4   OBJECTIVE:                                                                                                                           Vital signs in last 24 hours: Temp:  [97.3 F (36.3 C)-101.8 F (38.8 C)] 97.3 F (36.3 C) (10/21 1025) Pulse Rate:  [82-113] 88 (10/21 1019) Resp:  [12-28] 12 (10/21 1019) BP: (81-122)/(36-86) 108/61 mmHg (10/21 1018) SpO2:  [91 %-100 %] 98 % (10/21 1019) Weight:  [50.2 kg (110 lb 10.7 oz)] 50.2 kg (110 lb 10.7 oz) (10/20 2000)  Intake/Output from previous day: 10/20 0701 - 10/21 0700 In: -  Out: 600 [Urine:600] Intake/Output this shift:   Nutritional status: NPO  Past Medical History  Diagnosis Date  . Diabetes mellitus   . Alzheimer's disease   . Urinary tract infection, site not specified   . Hematuria, unspecified   . Chronic fatigue syndrome   . Alzheimer's dementia   . Colonic dysfunction   . Fibromyalgia   . Anemia   . Polymyalgia rheumatica   . Depression   . Anxiety   . Pancreatic insufficiency   . Unspecified transient cerebral ischemia    Physical exam: awake, no apparent distress.  Head: normocephalic.  Neck: mildly stiff, no bruits, no JVD.  Cardiac: no murmurs.  Lungs: clear.  Abdomen: soft, no tender, no mass.  Extremities: no edema.  Neurologic Exam:  Mental status: awake, confused. Follows some simple commands inconsistently. Says her name, no obvious dysarthria or dysphasia  CN 2-12: pupils seem to be symmetric, EOM appear to be preserved, no nystagmus.  Face symmetric. Tongue midline.  Motor: moves all limbs spontaneously and symmetrically.  Sensory: no tested  DTR's: 1+ all over.  Plantars: left upgoing, right downgoing.  Coordination and  gait: unable to test.  Lab Results: Lab Results  Component Value Date/Time   CHOL 236* 02/08/2012   Lipid Panel No results found for this basename: CHOL, TRIG, HDL, CHOLHDL, VLDL, LDLCALC,  in the last 72 hours  Studies/Results: Ct Angio Head W/cm &/or Wo Cm  10/21/2013   CLINICAL DATA:  Intracerebral hemorrhage.  EXAM: CT ANGIOGRAPHY HEAD  TECHNIQUE: Multidetector CT imaging of the head was performed using the standard protocol during bolus administration of intravenous contrast. Multiplanar CT image reconstructions and MIPs were obtained to evaluate the vascular anatomy.  CONTRAST:  30mL OMNIPAQUE IOHEXOL 350 MG/ML SOLN  COMPARISON:  Head CT 10/21/2013 and MRA 08/28/2007  FINDINGS: Acute subarachnoid hemorrhage does not appear significantly changed from recent noncontrast head CT, with the greatest volume of hemorrhage present in the right sylvian fissure extending into right frontoparietal sulci. A small amount of hemorrhage is also again seen in left temporal sulci, as well as at the posterior aspect of the right cerebellum. Small amount of intraventricular hemorrhage is again noted in the occipital horns of the lateral ventricles. No abnormal enhancement is identified. There is moderate cerebral atrophy with moderate chronic small vessel ischemic disease. There is no evidence of acute large territory infarct, mass, or midline shift.  Prior bilateral cataract extraction is noted. Mastoid air cells and visualized paranasal sinuses are clear.  Visualized distal vertebral arteries are patent and codominant. Left PICA origin is patent. Right PICA origin is not clearly identified although it may arise from the right AICA, which is patent and dominant. Basilar artery is patent without stenosis. SCA origins are  patent. There are medium sized posterior communicating arteries bilaterally with fetal type origins of the PCAs. Left P1 segment is hypoplastic. Right P1 segment is either severely hypoplastic or absent. PCAs are otherwise unremarkable.  Internal carotid arteries are patent from skullbase to carotid termini without stenosis. Minimal intracranial ICA calcification is noted. There is a 2 x 2 mm posteroinferiorly directed outpouching from the left supraclinoid ICA at the posterior communicating artery origin. ACAs and MCAs are unremarkable. Specifically, no right MCA aneurysm is identified in the region of greatest hemorrhage in the right sylvian fissure.  Review of the MIP images confirms the above findings.  IMPRESSION: 1. 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 This may be incidental given its location relative to the acute subarachnoid hemorrhage. No right MCA aneurysm identified. 2. No major intracranial arterial occlusion or significant stenosis. 3. No significant interval change in acute subarachnoid hemorrhage and small volume intraventricular hemorrhage.   Electronically Signed   By: Logan Bores   On: 10/21/2013 16:46   Dg Chest 2 View  10/21/2013   CLINICAL DATA:  Altered level of consciousness, mental status change ; history of TIAs and diabetes  EXAM: CHEST  2 VIEW  COMPARISON:  PA and lateral chest x-ray of December 18, 2008  FINDINGS: The lungs are borderline hypoinflated. There is no focal infiltrate. There is stable subcentimeter parenchymal density in the right mid lung consistent with scarring. There is minimal bibasilar linear increased density consistent with atelectasis. The cardiac silhouette is normal in size. The pulmonary vascularity is not engorged. The mediastinum is normal in width. There is no pleural effusion. The bony thorax is unremarkable.  IMPRESSION: There is no evidence of pneumonia nor CHF nor other acute cardiopulmonary abnormality. There is minimal bibasilar  atelectasis or scarring.   Electronically Signed   By: David  Martinique   On: 10/21/2013 13:25   Ct Head  Wo Contrast  10/21/2013   CLINICAL DATA:  73 year old female with hyperglycemia and altered mental status. History of Alzheimer's.  EXAM: CT HEAD WITHOUT CONTRAST  TECHNIQUE: Contiguous axial images were obtained from the base of the skull through the vertex without intravenous contrast.  COMPARISON:  CT 01/09/2009, 12/23/2008  FINDINGS: Unremarkable appearance of the calvarium without acute fracture or aggressive lesion. No scalp swelling.  No significant paranasal sinus disease. Mastoid air cells are clear.  Unremarkable appearance of the orbits with bilateral lens extraction.  Multi compartmental acute hemorrhage, with the predominant focus within the right sylvian fissure and right frontal sulci. There also appears to be a small amount of hemorrhage in the right petroclival region and posterior to the right sella turcica. Small focus of subarachnoid hemorrhage in the sulci of the left temporal lobe. There also appears to be a small amount of intraparenchymal hemorrhage versus subarachnoid hemorrhage of the right cerebellar hemisphere.  Hemorrhage layered within the bilateral posterior horns of the lateral ventricles.  Progression of brain volume loss with expansion of the ventricles. Periventricular hypodensity. Intracranial atherosclerosis.  No midline shift.  IMPRESSION: Multi compartmental acute intracranial hemorrhage, with a largest focus in the right sylvian fissure extending superiorly along the sulci and into the right frontal sulci.  Additional foci of subarachnoid hemorrhage include the left temporal sulci, left sylvian fissure, and along the right petroclival ligament. Focus of hemorrhage within the right cerebellar hemisphere may be subarachnoid or intraparenchymal, and there is also blood products layered within the posterior horn the lateral ventricles.  Progression of senescent brain volume  loss, periventricular white matter and associated intracranial atherosclerosis.  These results were called by telephone at the time of interpretation on 10/21/2013 at 1:34 pm to Dr. Domenic Moras , who verbally acknowledged these results.  Signed,  Dulcy Fanny. Earleen Newport, DO  [facility]  [facility]   Electronically Signed   By: Corrie Mckusick D.O.   On: 10/21/2013 13:37    MEDICATIONS                                                                                                                        Scheduled: . ceFEPime (MAXIPIME) IV  2 g Intravenous Q24H  . hydrocortisone sod succinate (SOLU-CORTEF) inj  50 mg Intravenous Q8H  . insulin aspart  0-9 Units Subcutaneous Q4H  . sodium chloride  3 mL Intravenous Q12H  . vancomycin  750 mg Intravenous Q24H    ASSESSMENT/PLAN:  73 y/o with advanced dementia brought in due to altered mental status for the last 3 or 4 days, most likely multifactorial (metabolic, SAH). Multifocal SAH: no evidence of MCA aneurysm on CT angio, incidental small 2 mm left Pcom aneurysm that doesn't explain the location of SAH noted on CT brain. She  had a recent fall and this could be post traumatic SAH ( unclear if she really hit her head) but with thrombocytopenia 39 will favor transfusing 2 units platelets. Follow up CT brain today. Will follow up.  Dorian Pod, MD [facility] [phone]  10/22/2013, 10:29 AM

## 2013-10-22 NOTE — Progress Notes (Signed)
OT Cancellation Note  Patient Details Name: DIERDRA SALAMEH MRN: [id number] DOB: 30-Aug-1940   Cancelled Treatment:    Reason Eval/Treat Not Completed: Patient not medically ready (Bedrest and currently at imaging)  Parke Poisson B 10/22/2013, 7:48 AM Pager: [phone]

## 2013-10-22 NOTE — Care Management Note (Signed)
    Page 1 of 1   10/22/2013     12:10:22 PM CARE MANAGEMENT NOTE 10/22/2013  Patient:  Jaclyn Hernandez, Jaclyn Hernandez   Account Number:  [id number]  Date Initiated:  10/22/2013  Documentation initiated by:  Marvetta Gibbons  Subjective/Objective Assessment:   Pt admitted with AMS, recent fall, Hemorrhagic CVA     Action/Plan:   PTA pt lived at home with spouse- PT/OT evals pending- NCM to follow for recommendations   Anticipated DC Date:  10/24/2013   Anticipated DC Plan:        DC Planning Services  CM consult      Choice offered to / List presented to:             Status of service:  In process, will continue to follow Medicare Important Message given?   (If response is "NO", the following Medicare IM given date fields will be blank) Date Medicare IM given:   Medicare IM given by:   Date Additional Medicare IM given:   Additional Medicare IM given by:    Discharge Disposition:    Per UR Regulation:  Reviewed for med. necessity/level of care/duration of stay  If discussed at [place] of Stay Meetings, dates discussed:    Comments:

## 2013-10-23 DIAGNOSIS — I609 Nontraumatic subarachnoid hemorrhage, unspecified: Principal | ICD-10-CM

## 2013-10-23 DIAGNOSIS — M353 Polymyalgia rheumatica: Secondary | ICD-10-CM

## 2013-10-23 DIAGNOSIS — R509 Fever, unspecified: Secondary | ICD-10-CM

## 2013-10-23 DIAGNOSIS — D61818 Other pancytopenia: Secondary | ICD-10-CM

## 2013-10-23 DIAGNOSIS — G9340 Encephalopathy, unspecified: Secondary | ICD-10-CM

## 2013-10-23 DIAGNOSIS — D649 Anemia, unspecified: Secondary | ICD-10-CM

## 2013-10-23 DIAGNOSIS — G309 Alzheimer's disease, unspecified: Secondary | ICD-10-CM

## 2013-10-23 DIAGNOSIS — F0390 Unspecified dementia without behavioral disturbance: Secondary | ICD-10-CM

## 2013-10-23 DIAGNOSIS — F0391 Unspecified dementia with behavioral disturbance: Secondary | ICD-10-CM

## 2013-10-23 DIAGNOSIS — D72819 Decreased white blood cell count, unspecified: Secondary | ICD-10-CM

## 2013-10-23 DIAGNOSIS — E1165 Type 2 diabetes mellitus with hyperglycemia: Secondary | ICD-10-CM

## 2013-10-23 DIAGNOSIS — F0280 Dementia in other diseases classified elsewhere without behavioral disturbance: Secondary | ICD-10-CM

## 2013-10-23 DIAGNOSIS — D709 Neutropenia, unspecified: Secondary | ICD-10-CM

## 2013-10-23 LAB — COMPREHENSIVE METABOLIC PANEL
ALBUMIN: 2.1 g/dL — AB (ref 3.5–5.2)
ALT: 90 U/L — ABNORMAL HIGH (ref 0–35)
AST: 107 U/L — AB (ref 0–37)
Alkaline Phosphatase: 59 U/L (ref 39–117)
Anion gap: 12 (ref 5–15)
BILIRUBIN TOTAL: 0.3 mg/dL (ref 0.3–1.2)
BUN: 22 mg/dL (ref 6–23)
CHLORIDE: 111 meq/L (ref 96–112)
CO2: 23 mEq/L (ref 19–32)
Calcium: 7.4 mg/dL — ABNORMAL LOW (ref 8.4–10.5)
Creatinine, Ser: 0.63 mg/dL (ref 0.50–1.10)
GFR calc Af Amer: >90 mL/min (ref >90)
GFR calc non Af Amer: 86 mL/min — ABNORMAL LOW (ref >90)
Glucose, Bld: 349 mg/dL — ABNORMAL HIGH (ref 70–99)
Potassium: 3.4 mEq/L — ABNORMAL LOW (ref 3.7–5.3)
Sodium: 146 mEq/L (ref 137–147)
Total Protein: 5.5 g/dL — ABNORMAL LOW (ref 6.0–8.3)

## 2013-10-23 LAB — CBC WITH DIFFERENTIAL/PLATELET
Basophils Absolute: 0 10*3/uL (ref 0.0–0.1)
Basophils Relative: 1 % (ref 0–1)
EOS PCT: 0 % (ref 0–5)
Eosinophils Absolute: 0 10*3/uL (ref 0.0–0.7)
HCT: 20.1 % — ABNORMAL LOW (ref 36.0–46.0)
Hemoglobin: 6.7 g/dL — CL (ref 12.0–15.0)
Lymphocytes Relative: 30 % (ref 12–46)
Lymphs Abs: 0.2 10*3/uL — ABNORMAL LOW (ref 0.7–4.0)
MCH: 37 pg — ABNORMAL HIGH (ref 26.0–34.0)
MCHC: 33.3 g/dL (ref 30.0–36.0)
MCV: 111 fL — AB (ref 78.0–100.0)
MONOS PCT: 2 % — AB (ref 3–12)
Monocytes Absolute: 0 10*3/uL — ABNORMAL LOW (ref 0.1–1.0)
NEUTROS ABS: 0.5 10*3/uL — AB (ref 1.7–7.7)
Neutrophils Relative %: 67 % (ref 43–77)
Platelets: 110 10*3/uL — ABNORMAL LOW (ref 150–400)
RBC: 1.81 MIL/uL — AB (ref 3.87–5.11)
RDW: 16.3 % — AB (ref 11.5–15.5)
WBC: 0.7 10*3/uL — CL (ref 4.0–10.5)

## 2013-10-23 LAB — GLUCOSE, CAPILLARY
GLUCOSE-CAPILLARY: 382 mg/dL — AB (ref 70–99)
GLUCOSE-CAPILLARY: 246 mg/dL — AB (ref 70–99)
GLUCOSE-CAPILLARY: 270 mg/dL — AB (ref 70–99)
Glucose-Capillary: 251 mg/dL — ABNORMAL HIGH (ref 70–99)
Glucose-Capillary: 267 mg/dL — ABNORMAL HIGH (ref 70–99)

## 2013-10-23 LAB — PREPARE PLATELET PHERESIS: Unit division: 0

## 2013-10-23 LAB — PROTIME-INR
INR: 1.21 (ref 0.00–1.49)
PROTHROMBIN TIME: 15.4 s — AB (ref 11.6–15.2)

## 2013-10-23 LAB — HEMOGLOBIN A1C
HEMOGLOBIN A1C: 7.3 % — AB (ref <5.7)
MEAN PLASMA GLUCOSE: 163 mg/dL — AB (ref <117)

## 2013-10-23 LAB — PREPARE RBC (CROSSMATCH)

## 2013-10-23 MED ORDER — SODIUM CHLORIDE 0.9 % IV SOLN
Freq: Once | INTRAVENOUS | Status: AC
  Administered 2013-10-23: 13:00:00 via INTRAVENOUS

## 2013-10-23 MED ORDER — INFLUENZA VAC SPLIT QUAD 0.5 ML IM SUSY
0.5000 mL | PREFILLED_SYRINGE | INTRAMUSCULAR | Status: DC
  Filled 2013-10-23: qty 0.5

## 2013-10-23 MED ORDER — SODIUM CHLORIDE 0.9 % IV SOLN
Freq: Once | INTRAVENOUS | Status: AC
  Administered 2013-10-23: 06:00:00 via INTRAVENOUS

## 2013-10-23 MED ORDER — INSULIN ASPART 100 UNIT/ML ~~LOC~~ SOLN
0.0000 [IU] | Freq: Every day | SUBCUTANEOUS | Status: DC
  Administered 2013-10-23: 3 [IU] via SUBCUTANEOUS
  Administered 2013-10-24: 2 [IU] via SUBCUTANEOUS

## 2013-10-23 MED ORDER — INSULIN ASPART 100 UNIT/ML ~~LOC~~ SOLN
4.0000 [IU] | Freq: Once | SUBCUTANEOUS | Status: AC
  Administered 2013-10-23: 4 [IU] via SUBCUTANEOUS

## 2013-10-23 MED ORDER — INSULIN ASPART 100 UNIT/ML ~~LOC~~ SOLN
0.0000 [IU] | Freq: Three times a day (TID) | SUBCUTANEOUS | Status: DC
  Administered 2013-10-23: 5 [IU] via SUBCUTANEOUS
  Administered 2013-10-24: 11 [IU] via SUBCUTANEOUS
  Administered 2013-10-24: 8 [IU] via SUBCUTANEOUS
  Administered 2013-10-24 – 2013-10-25 (×2): 5 [IU] via SUBCUTANEOUS

## 2013-10-23 MED ORDER — GLUCERNA SHAKE PO LIQD
237.0000 mL | Freq: Three times a day (TID) | ORAL | Status: DC
  Administered 2013-10-23 – 2013-10-24 (×5): 237 mL via ORAL

## 2013-10-23 NOTE — Clinical Documentation Improvement (Signed)
MD's, NP's,and PA's  On admit patient had fever of 101.8 P (113, 103), R 27, WBC (0.7/0.9), patient started on IV Cefepime and  Vancomycin.  Please document  clinical condition if appropriate . Thank you   Possible Clinical Conditions?  Septicemia / Sepsis  Neutropenic Sepsis   SIRS  Other Condition   Cannot clinically Determine     Risk Factors: Neutropenic Fever  Treatment: IV Cefepime, Vancomycin  Thank You, Ree Kida ,RN Clinical Documentation Specialist:  [phone]  [facility] Information Management

## 2013-10-23 NOTE — Progress Notes (Signed)
Inpatient Diabetes Program Recommendations  AACE/ADA: New Consensus Statement on Inpatient Glycemic Control (2013)  Target Ranges:  Prepandial:   less than 140 mg/dL      Peak postprandial:   less than 180 mg/dL (1-2 hours)      Critically ill patients:  140 - 180 mg/dL   Results for Jaclyn Hernandez, Jaclyn Hernandez (MRN [id number]) as of 10/23/2013 07:59  Ref. Range 10/22/2013 07:36 10/22/2013 11:51 10/22/2013 15:25 10/22/2013 22:10 10/23/2013 01:22  Glucose-Capillary Latest Range: 70-99 mg/dL 200 (H) 179 (H) 175 (H) 292 (H) 382 (H)    Reason for Assessment: elevating CBG  Diabetes history:Type 2 Outpatient Diabetes medications: Januvia 100mg /day, Amaryl 1mg /day Current orders for Inpatient glycemic control: Novolog correction 0-9 units tid with meals   It does not appear that the patient received Lantus or Levemir insulin when she was transitioned off the insulin drip.  This, combined with current steroids, has resulted in elevated blood sugars now.  Please consider adding basal insulin 5- 10 units (50kg a .1= 5 units...Marland KitchenMarland Kitchen50kg x .2 = 10 units) daily starting immediately, and adding Novolog 0-5 units qhs. We will continue to follow.    Gentry Fitz, RN, BA, MHA, CDE Diabetes Coordinator Inpatient Diabetes Program  [phone] (Team Pager) [phone] [facility]) 10/23/2013 8:14 AM

## 2013-10-23 NOTE — Evaluation (Signed)
Physical Therapy Evaluation Patient Details Name: Jaclyn Hernandez MRN: [id number] DOB: 05/28/1940 Today's Date: 10/23/2013   History of Present Illness  73 y/o with advanced dementia brought in due to altered mental status for the last 3 or 4 days, most likely multifactorial (metabolic, SAH).  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 She had a recent fall and this could be post traumatic SAH.  No hx of prior swallow evals in medical records.  Pt lived at home with family PTA.  Clinical Impression  Patient demonstrates deficits in functional mobility as indicated below. Will benefit from continued skilled PT to address deficits and maximize function. Will see as indicated and progress as tolerated.     Follow Up Recommendations SNF;Supervision/Assistance - 24 hour    Equipment Recommendations  Other (comment) (TBD)    Recommendations for Other Services       Precautions / Restrictions Precautions Precautions: Fall      Mobility  Bed Mobility Overal bed mobility: Needs Assistance;+2 for physical assistance Bed Mobility: Supine to Sit     Supine to sit: Total assist        Transfers Overall transfer level: Needs assistance Equipment used:  (face to face gait belt and chuck pad) Transfers: Sit to/from Stand Sit to Stand: Total assist         General transfer comment: face to face gait belt and chuck pad  Ambulation/Gait                Stairs            Wheelchair Mobility    Modified Rankin (Stroke Patients Only)       Balance  Needs assist  Sitting-balance support: Feet supported Sitting balance-Leahy Scale: Zero   Postural control: Posterior lean                                   Pertinent Vitals/Pain      Home Living Family/patient expects to be discharged to:: Private residence Living Arrangements:  Spouse/significant other                    Prior Function           Comments: per chart, patient had lived at home with her spouse and had been ambulatory with RW, decreasing mobility over the past few wks     Hand Dominance        Extremity/Trunk Assessment   Upper Extremity Assessment: Generalized weakness;Difficult to assess due to impaired cognition           Lower Extremity Assessment: Generalized weakness;Difficult to assess due to impaired cognition         Communication      Cognition Arousal/Alertness: Awake/alert   Overall Cognitive Status: Impaired/Different from baseline Area of Impairment: Orientation;Attention;Following commands;Safety/judgement;Awareness;Problem solving Orientation Level: Person Current Attention Level: Focused;Sustained Memory: Decreased short-term memory Following Commands: Follows one step commands inconsistently Safety/Judgement: Decreased awareness of safety;Decreased awareness of deficits   Problem Solving: Slow processing;Decreased initiation;Difficulty sequencing;Requires verbal cues;Requires tactile cues      General Comments  Attempted EOB activities, patient with inability to maintain balance, posterior pushing >4 mins EOB Max to total assist    Exercises        Assessment/Plan    PT Assessment Patient needs  continued PT services  PT Diagnosis Difficulty walking;Generalized weakness;Altered mental status   PT Problem List Decreased strength;Decreased activity tolerance;Decreased balance;Decreased mobility;Decreased coordination;Decreased cognition;Decreased safety awareness  PT Treatment Interventions DME instruction;Gait training;Functional mobility training;Therapeutic activities;Therapeutic exercise;Balance training;Cognitive remediation;Patient/family education   PT Goals (Current goals can be found in the Care Plan section) Acute Rehab PT Goals PT Goal Formulation: Patient unable to participate in  goal setting Time For Goal Achievement: 11/06/13 Potential to Achieve Goals: Fair    Frequency Min 2X/week   Barriers to discharge        Co-evaluation               End of Session Equipment Utilized During Treatment: Gait belt Activity Tolerance: Patient tolerated treatment well Patient left: in chair;with family/visitor present Nurse Communication: Mobility status         Time: 8469-6295 PT Time Calculation (min): 14 min   Charges:   PT Evaluation $Initial PT Evaluation Tier I: 1 Procedure PT Treatments $Therapeutic Activity: 8-22 mins   PT G CodesDuncan Dull 10/23/2013, 12:48 PM Alben Deeds, [place] DPT  [phone]

## 2013-10-23 NOTE — Progress Notes (Signed)
Jaclyn Hernandez 1 - Stepdown / ICU Progress Note  MARKI FREDE YBW:[id number] DOB: 06-Aug-1940 DOA: 10/21/2013 PCP: Kevan Ny, MD   Brief narrative: 73 year old BF PMHx diabetes and advanced Alzheimer disease as well as polymyalgia rheumatica. Lives at home with husband, She was found to have progressive altered mentation over the past 24 hours as evidenced by lethargy and inability to eat. At baseline she ambulates with a walker. For the past 2 weeks the family has noted the patient becoming less ambulatory. Over the past 24 hours less responsive and not eating.  Upon arrival to the ER patient underwent a CT of the head which showed an multi-compartmental acute subarachnoid hemorrhages. Neurology was consulted. She was also found to have an elevated blood sugar of 400. Patient was noted to be pancytopenic. She has had chronic thrombocytopenia since 2012 but the leukopenia was new. Her white blood cell count was 700 with a low absolute neutrophil count. In the ER the patient was also noted to be febrile with a temperature of 101.3 and tachycardic. Her sodium was elevated at 149 with elevated ALT and AST as well as mildly elevated creatinine are concerning for volume depletion.  Since admission she has been evaluated by the neurologist. 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 No right MCA aneurysm identified. Her sodium has further increased to 158. Her platelets have dropped to 39,000 and given the acute intracranial bleeding the neurologist has requested that platelets be given. Unfortunately this morning she is alert and has appeared to return to her baseline mentation according to the family.  HPI/Subjective: Patient is alert and confused but according to her husband she is at her baseline.  Assessment/Plan:  Acute Subarachnoid  hemorrhage Appreciate neurology assistance-has tiny left PCA aneurysm which according to her neurology is not the etiology for the patient's subarachnoid hemorrhage-family has reported to neurologist recent fall and subarachnoid could be posttraumatic-no MCA aneurysm on CT angio-repeat CT brain 10/21 with decreased volume of subarachnoid hemorrhage/consider repeating in 2-3 days per nephrology  Acute metabolic encephalopathy Appears to be resolved based on family's report of patient near baseline mentation  Pancytopenia Patient has had low platelets since 2012 with a platelet count at that time of 124,000-in 2013 platelets were down to 60,000-in March of this year platelets 44,000-at presentation platelets were 51,000 and today 39,000 so plan on one unit of platelets-in abscence of clinical evidence to support infection antibiotics were discontinued on 10/21 with plans to follow clincally-mild transaminitis without elevated total bilirubin so doubt hemolysis-concerned patient may have evolving myelodysplastic syndrome - follow counts for now -have discussed with husband and he agrees for hematology evaluation although uncertain if he would be amenable to pursuing bone marrow aspiration-peripheral smear without schistocytes so current process is not hemolytic in nature  Hypernatremia /Dehydration with mild acute renal failure Appeared volume depleted based on BUN and creatinine as well as elevated lactic acid so IV fluids were initiated-blood pressure improved but remains soft-urine output slowly picking up-oral intake variable so we'll continue IV fluids for now  Diabetes mellitus Was on Amaryl and Januvia pre-admission-CBGs have been consistently greater than 150 since admission-continue sliding scale insulin-hemoglobin A1c 7.3 and given patient's advanced dementia and variable oral intake would be reluctant to pursue very tight glycemic control- stress dose steroids could be influencing current  hyperglycemia her for Will adjust  sliding scale up to moderate  Polymyalgia rheumatica on chronic prednisone Blood pressure soft  -continue stress dose steroids: Solu-Cortef 50 mg IV Q8 hours  Advanced Alzheimer's dementia On Celexa, Namenda and Exelon at home-with acute encephalopathy resolved consider resuming these medications  DVT prophylaxis: SCDs Code Status: DO NOT RESUSCITATE Family Communication: Husband at bedside Disposition Plan/Expected LOS: Stepdown   Consultants: Dr. Armida Sans /Neurology Dr. Fredric Dine /hematology  Procedures: None  Cultures: Urine culture-no growth Blood cultures pending  Antibiotics: Cefepime 10/20 >>10/21 Vancomycin 10/20 >>10/21  Objective: Blood pressure 112/61, pulse 69, temperature 98.4 F (36.9 C), temperature source Oral, resp. rate 13, height 5' 1.81" (1.57 m), weight 110 lb 10.7 oz (50.2 kg), SpO2 95.00%.  Intake/Output Summary (Last 24 hours) at 10/23/13 1545 Last data filed at 10/23/13 1515  Gross per 24 hour  Intake    720 ml  Output      0 ml  Net    720 ml     Exam: Gen: No acute respiratory distress-alert and confused and follows commands with prompting Chest: Clear to auscultation bilaterally, room air Cardiac: Regular rate and rhythm, S1-S2, no rubs murmurs or gallops, no peripheral edema, no JVD Abdomen: Soft nontender nondistended without obvious hepatosplenomegaly, no ascites Extremities: Symmetrical in appearance without cyanosis, clubbing or effusion  Scheduled Meds:  Scheduled Meds: . antiseptic oral rinse  7 mL Mouth Rinse q12n4p  . chlorhexidine  15 mL Mouth Rinse BID  . citalopram  20 mg Oral Daily  . feeding supplement (GLUCERNA SHAKE)  237 mL Oral TID BM  . hydrocortisone sod succinate (SOLU-CORTEF) inj  50 mg Intravenous Q8H  . [START ON 10/24/2013] Influenza vac split quadrivalent PF  0.5 mL Intramuscular Tomorrow-1000  . insulin aspart  0-9 Units Subcutaneous TID WC  . Memantine HCl ER  28 mg Oral  Daily  . rivastigmine  3 mg Oral BID    Data Reviewed: Basic Metabolic Panel:  Recent Labs Lab 10/21/13 1131 10/22/13 0202 10/22/13 1324 10/23/13 0300  NA 149* 158* 156* 146  K 4.6 3.9 4.1 3.4*  CL 109 121* 117* 111  CO2 26 25 24 23   GLUCOSE 441* 136* 175* 349*  BUN 42* 29* 28* 22  CREATININE 1.15* 0.68 0.64 0.63  CALCIUM 8.6 8.4 8.5 7.4*   Liver Function Tests:  Recent Labs Lab 10/21/13 1131 10/23/13 0300  AST 119* 107*  ALT 110* 90*  ALKPHOS 77 59  BILITOT 0.5 0.3  PROT 7.3 5.5*  ALBUMIN 2.9* 2.1*   CBC:  Recent Labs Lab 10/21/13 1131 10/22/13 0202 10/23/13 0300  WBC 0.7* 0.9* 0.7*  NEUTROABS 0.4*  --  0.5*  HGB 9.1* 8.4* 6.7*  HCT 28.5* 25.7* 20.1*  MCV 114.0* 115.2* 111.0*  PLT 51* 39* 110*   CBG:  Recent Labs Lab 10/22/13 1525 10/22/13 2210 10/23/13 0122 10/23/13 0729 10/23/13 1245  GLUCAP 175* 292* 382* 251* 267*    Recent Results (from the past 240 hour(s))  URINE CULTURE     Status: None   Collection Time    10/21/13 11:35 AM      Result Value Ref Range Status   Specimen Description URINE, RANDOM   Final   Special Requests ADDED [id number]   Final   Culture  Setup Time     Final   Value: 10/21/2013 17:42     Performed at [facility]     Final   Value: NO GROWTH     Performed at  [facility]     Final   Value: NO GROWTH     Performed at [facility]   Report Status 10/22/2013 FINAL   Final  CULTURE, BLOOD (ROUTINE X 2)     Status: None   Collection Time    10/21/13 12:00 PM      Result Value Ref Range Status   Specimen Description BLOOD RIGHT ANTECUBITAL   Final   Special Requests BOTTLES DRAWN AEROBIC AND ANAEROBIC 5CC   Final   Culture  Setup Time     Final   Value: 10/21/2013 17:33     Performed at [facility]   Culture     Final   Value:        BLOOD CULTURE RECEIVED NO GROWTH TO DATE CULTURE WILL BE HELD FOR 5 DAYS BEFORE ISSUING A FINAL NEGATIVE REPORT      Performed at [facility]   Report Status PENDING   Incomplete  CULTURE, BLOOD (ROUTINE X 2)     Status: None   Collection Time    10/21/13 12:08 PM      Result Value Ref Range Status   Specimen Description BLOOD HAND RIGHT   Final   Special Requests BOTTLES DRAWN AEROBIC ONLY 5CC   Final   Culture  Setup Time     Final   Value: 10/21/2013 17:33     Performed at [facility]   Culture     Final   Value:        BLOOD CULTURE RECEIVED NO GROWTH TO DATE CULTURE WILL BE HELD FOR 5 DAYS BEFORE ISSUING A FINAL NEGATIVE REPORT     Performed at [facility]   Report Status PENDING   Incomplete  MRSA PCR SCREENING     Status: None   Collection Time    10/22/13 10:12 AM      Result Value Ref Range Status   MRSA by PCR NEGATIVE  NEGATIVE Final   Comment:            The GeneXpert MRSA Assay (FDA     approved for NASAL specimens     only), is one component of a     comprehensive MRSA colonization     surveillance program. It is not     intended to diagnose MRSA     infection nor to guide or     monitor treatment for     MRSA infections.     Studies:  Recent x-ray studies have been reviewed in detail by the Attending Physician  Time spent :  [place], ANP [facility] Office  [phone] Pager [phone]   **If unable to reach the above provider after paging please contact the [place] @ [phone]  On-Call/Text Page:      [email or web address]      password [email or web address]  If 7PM-7AM, please contact night-coverage [email or web address] Password [email or web address] 10/23/2013, 3:45 PM   LOS: 2 days   Examined patient and discussed assessment and plan with ANP Ebony Hail and agree with above. Patient with multiple complex medical problems> 40 minutes spent in direct patient care

## 2013-10-23 NOTE — Progress Notes (Signed)
INITIAL NUTRITION ASSESSMENT  DOCUMENTATION CODES Per approved criteria  -Non-severe (moderate) malnutrition in the context of chronic illness   Pt meets criteria for non-severe (moderate) MALNUTRITION in the context of chronic illness as evidenced by intake < 75% of estimated energy requirement for >/= 1 month with mild depletion of muscle mass and 5% weight loss in one month.  INTERVENTION:  Glucerna Shake PO TID, each supplement provides 220 kcal and 10 grams of protein  NUTRITION DIAGNOSIS: Inadequate oral intake related to poor appetite as evidenced by </= 30% meal completion.   Goal: Intake to meet >90% of estimated nutrition needs.  Monitor:  PO intake, labs, weight trend.  Reason for Assessment: MST  73 y.o. female  Admitting Dx: SAH  ASSESSMENT: Patient was brought to the ED with progressive altered mentation over the previous 24 hours with lethargy and inability to eat. CT of head in ED showed multi-compartmental acute subarachnoid hemorrhages.   Patient with 4% weight loss in 6 months. S/P swallow evaluation with SLP, recommended continuing home diet of dysphagia 3 diet with thin liquids and chopped meats.  Husband reports that patient has been eating fair at home. She weighed 115-116 lb ~1 month ago, now down to 110 lb. She likes Ensure supplements. RN reports that blood sugars are elevated due to steroids.  Nutrition Focused Physical Exam:  Subcutaneous Fat:  Orbital Region: WNL Upper Arm Region: WNL Thoracic and Lumbar Region: NA  Muscle:  Temple Region: WNL Clavicle Bone Region: mild depletion Clavicle and Acromion Bone Region: mild depletion Scapular Bone Region: NA Dorsal Hand: NA Patellar Region: WNL Anterior Thigh Region: mild depletion Posterior Calf Region: WNL  Edema: none   Height: Ht Readings from Last 1 Encounters:  10/21/13 5' 1.81" (1.57 m)    Weight: Wt Readings from Last 1 Encounters:  10/21/13 110 lb 10.7 oz (50.2 kg)     Ideal Body Weight: 50 kg  % Ideal Body Weight: 100%  Wt Readings from Last 10 Encounters:  10/21/13 110 lb 10.7 oz (50.2 kg)  09/24/13 110 lb 9.6 oz (50.168 kg)  04/23/13 114 lb (51.71 kg)  10/23/12 118 lb (53.524 kg)  03/19/12 120 lb (54.432 kg)  03/18/10 114 lb (51.71 kg)    Usual Body Weight: 114 lb  % Usual Body Weight: 96%  BMI:  Body mass index is 20.37 kg/(m^2).  Estimated Nutritional Needs: Kcal: 1200-1400 Protein: 65-75 gm Fluid: 1.2-1.4 L  Skin: abrasions to buttocks and knee  Diet Order: Dysphagia 3 with thin liquids, chopped meats.  EDUCATION NEEDS: -Education not appropriate at this time   Intake/Output Summary (Last 24 hours) at 10/23/13 1045 Last data filed at 10/22/13 1800  Gross per 24 hour  Intake    820 ml  Output      0 ml  Net    820 ml    Last BM: PTA   Labs:   Recent Labs Lab 10/22/13 0202 10/22/13 1324 10/23/13 0300  NA 158* 156* 146  K 3.9 4.1 3.4*  CL 121* 117* 111  CO2 25 24 23   BUN 29* 28* 22  CREATININE 0.68 0.64 0.63  CALCIUM 8.4 8.5 7.4*  GLUCOSE 136* 175* 349*    CBG (last 3)   Recent Labs  10/22/13 2210 10/23/13 0122 10/23/13 0729  GLUCAP 292* 382* 251*    Scheduled Meds: . sodium chloride   Intravenous Once  . antiseptic oral rinse  7 mL Mouth Rinse q12n4p  . chlorhexidine  15 mL Mouth Rinse  BID  . citalopram  20 mg Oral Daily  . hydrocortisone sod succinate (SOLU-CORTEF) inj  50 mg Intravenous Q8H  . [START ON 10/24/2013] Influenza vac split quadrivalent PF  0.5 mL Intramuscular Tomorrow-1000  . insulin aspart  0-9 Units Subcutaneous TID WC  . Memantine HCl ER  28 mg Oral Daily  . rivastigmine  3 mg Oral BID    Continuous Infusions: . sodium chloride Stopped (10/23/13 0604)    Past Medical History  Diagnosis Date  . Diabetes mellitus   . Alzheimer's disease   . Urinary tract infection, site not specified   . Hematuria, unspecified   . Chronic fatigue syndrome   . Alzheimer's dementia    . Colonic dysfunction   . Fibromyalgia   . Anemia   . Polymyalgia rheumatica   . Depression   . Anxiety   . Pancreatic insufficiency   . Unspecified transient cerebral ischemia     Past Surgical History  Procedure Laterality Date  . None      Molli Barrows, RD, LDN, [place] Pager [phone] After Hours Pager [phone]

## 2013-10-23 NOTE — Consult Note (Signed)
[place]  [phone] [phone]     ID: Jaclyn Hernandez DOB: [birth year]  MR#: [id number]  VCB#:[id number]  Patient Care Team: Rogers Blocker, MD as PCP - General (Internal Medicine) PCP: Marlou Sa ERIC, MD  CHIEF COMPLAINT: pancytopenia  CURRENT TREATMENT: workup in process   HISTORY OF PRESENT ILLNESS: [name] who was in her usual state of health until 10/18/2013, at which point her husband tells me she fell, became more lethargic and stopped eating (may have not taken meds). It is not clear whether there was any seizure activity. When CNA evaluated the patient 10/21/2013 she found her to be febrile with a glc >500. The patient was brought to the ED where she was found to have a rectal temp of 101.8 and a glc of 441. A non contrast head CT showed an acute multicompartment SAH.   Labs on admission showed a WBC of 0.7, Hb 9.1 with MCV 114, and platelets 51K  (CBC 03/23/2013 showed WBC 4.9 and Hb 11.7, MCV 107, platelets 44K). The patient was transfused PRBCs and platelets today. We were consulted after the transfusion for evaluation of pancytopenia.  INTERVAL HISTORY: I met with the patient and her husband in her room 10/23/2013  REVIEW OF SYSTEMS: The patient is unable to give me detailed review of systems except to say she's "fine." History above was obtained from husband and chart. He tells me the patient is now "back to the way she usually is." The husband was not aware of fever or bleeding problems at home. It is not clear whether patient missed her glycemic-control medications or whether the high glc levels were due to infection.  PAST MEDICAL HISTORY: Past Medical History  Diagnosis Date  . Diabetes mellitus   . Alzheimer's disease   . Urinary tract infection, site not specified   . Hematuria, unspecified   . Chronic fatigue syndrome   . Alzheimer's dementia   . Colonic dysfunction   . Fibromyalgia   . Anemia     . Polymyalgia rheumatica   . Depression   . Anxiety   . Pancreatic insufficiency   . Unspecified transient cerebral ischemia     PAST SURGICAL HISTORY: Past Surgical History  Procedure Laterality Date  . None      FAMILY HISTORY Family History  Problem Relation Age of Onset  . Cancer Mother   . Diabetes Father      ADVANCED DIRECTIVES: DNR in place   HEALTH MAINTENANCE: History  Substance Use Topics  . Smoking status: Former Smoker    Quit date: 01/02/1982  . Smokeless tobacco: Never Used  . Alcohol Use: No     Comment: quit in 1982    :  Allergies  Allergen Reactions  . Sulfa Antibiotics Other (See Comments)    unknown    Current Facility-Administered Medications  Medication Dose Route Frequency Provider Last Rate Last Dose  . 0.45 % sodium chloride infusion   Intravenous Continuous Cherene Altes, MD      . acetaminophen (TYLENOL) suppository 650 mg  650 mg Rectal Q6H PRN Samella Parr, NP      . acetaminophen (TYLENOL) tablet 500 mg  500 mg Oral Q6H PRN Cherene Altes, MD      . albuterol (PROVENTIL) (2.5 MG/3ML) 0.083% nebulizer solution 2.5 mg  2.5 mg Nebulization Q2H PRN Phillips Climes, MD      . antiseptic oral rinse (CPC / CETYLPYRIDINIUM CHLORIDE 0.05%) solution 7 mL  7  mL Mouth Rinse q12n4p Cherene Altes, MD   7 mL at 10/23/13 1709  . chlorhexidine (PERIDEX) 0.12 % solution 15 mL  15 mL Mouth Rinse BID Cherene Altes, MD   15 mL at 10/23/13 [phone]  . citalopram (CELEXA) tablet 20 mg  20 mg Oral Daily Cherene Altes, MD   20 mg at 10/23/13 1000  . dextrose 50 % solution 25 mL  25 mL Intravenous PRN Phillips Climes, MD      . feeding supplement (GLUCERNA SHAKE) (GLUCERNA SHAKE) liquid 237 mL  237 mL Oral TID BM Dalene Carrow, RD   237 mL at 10/23/13 1500  . hydrocortisone sodium succinate (SOLU-CORTEF) 100 MG injection 50 mg  50 mg Intravenous Q8H Dawood Elgergawy, MD   50 mg at 10/23/13 1200  . [START ON 10/24/2013]  Influenza vac split quadrivalent PF (FLUARIX) injection 0.5 mL  0.5 mL Intramuscular Tomorrow-1000 Allie Bossier, MD      . insulin aspart (novoLOG) injection 0-15 Units  0-15 Units Subcutaneous TID WC Samella Parr, NP   5 Units at 10/23/13 1709  . insulin aspart (novoLOG) injection 0-5 Units  0-5 Units Subcutaneous QHS Samella Parr, NP      . Memantine HCl ER CP24 28 mg  28 mg Oral Daily Cherene Altes, MD   28 mg at 10/23/13 1001  . rivastigmine (EXELON) capsule 3 mg  3 mg Oral BID Cherene Altes, MD   3 mg at 10/23/13 1002    OBJECTIVE: elderly African American woman examined in bed Filed Vitals:   10/23/13 1943  BP: 102/60  Pulse: 85  Temp: 98.3 F (36.8 C)  Resp: 15     Body mass index is 20.37 kg/(m^2).    Ocular: Sclerae unicteric, EOMs intact Ear-nose-throat: Oropharynx clear Lymphatic: No cervical or supraclavicular adenopathy Lungs no rales or rhonchi, auscultated anterolaterally Heart regular rate and rhythm Abd soft, nontender, positive bowel sounds Neuro: non-focal, pleasantly disoriented with good social responses, clear speech Breasts: deferred   LAB RESULTS:  CMP     Component Value Date/Time   NA 146 10/23/2013 0300   NA 139 02/08/2012   K 3.4* 10/23/2013 0300   CL 111 10/23/2013 0300   CO2 23 10/23/2013 0300   GLUCOSE 349* 10/23/2013 0300   BUN 22 10/23/2013 0300   BUN 22* 02/08/2012   CREATININE 0.63 10/23/2013 0300   CREATININE 1.0 02/08/2012   CALCIUM 7.4* 10/23/2013 0300   PROT 5.5* 10/23/2013 0300   ALBUMIN 2.1* 10/23/2013 0300   AST 107* 10/23/2013 0300   ALT 90* 10/23/2013 0300   ALKPHOS 59 10/23/2013 0300   BILITOT 0.3 10/23/2013 0300   GFRNONAA 86* 10/23/2013 0300   GFRAA >90 10/23/2013 0300    I No results found for this basename: SPEP, UPEP,  kappa and lambda light chains    Lab Results  Component Value Date   WBC 0.7* 10/23/2013   NEUTROABS 0.5* 10/23/2013   HGB 6.7* 10/23/2013   HCT 20.1* 10/23/2013   MCV 111.0*  10/23/2013   PLT 110* 10/23/2013    _0 @  No results found for this basename: LABCA2    No components found with this basename: LABCA125     Recent Labs Lab 10/23/13 1110  INR 1.21    Urinalysis    Component Value Date/Time   COLORURINE YELLOW 10/21/2013 [place] 10/21/2013 1135   LABSPEC 1.025 10/21/2013 1135   PHURINE 5.5 10/21/2013 1135  GLUCOSEU >1000* 10/21/2013 1135   HGBUR MODERATE* 10/21/2013 1135   BILIRUBINUR NEGATIVE 10/21/2013 1135   KETONESUR 15* 10/21/2013 1135   PROTEINUR NEGATIVE 10/21/2013 1135   UROBILINOGEN 0.2 10/21/2013 1135   NITRITE NEGATIVE 10/21/2013 1135   LEUKOCYTESUR NEGATIVE 10/21/2013 1135    STUDIES: Ct Angio Head W/cm &/or Wo Cm  10/21/2013   CLINICAL DATA:  Intracerebral hemorrhage.  EXAM: CT ANGIOGRAPHY HEAD  TECHNIQUE: Multidetector CT imaging of the head was performed using the standard protocol during bolus administration of intravenous contrast. Multiplanar CT image reconstructions and MIPs were obtained to evaluate the vascular anatomy.  CONTRAST:  61m OMNIPAQUE IOHEXOL 350 MG/ML SOLN  COMPARISON:  Head CT 10/21/2013 and MRA 08/28/2007  FINDINGS: Acute subarachnoid hemorrhage does not appear significantly changed from recent noncontrast head CT, with the greatest volume of hemorrhage present in the right sylvian fissure extending into right frontoparietal sulci. A small amount of hemorrhage is also again seen in left temporal sulci, as well as at the posterior aspect of the right cerebellum. Small amount of intraventricular hemorrhage is again noted in the occipital horns of the lateral ventricles. No abnormal enhancement is identified. There is moderate cerebral atrophy with moderate chronic small vessel ischemic disease. There is no evidence of acute large territory infarct, mass, or midline shift.  Prior bilateral cataract extraction is noted. Mastoid air cells and visualized paranasal sinuses are clear.   Visualized distal vertebral arteries are patent and codominant. Left PICA origin is patent. Right PICA origin is not clearly identified although it may arise from the right AICA, which is patent and dominant. Basilar artery is patent without stenosis. SCA origins are patent. There are medium sized posterior communicating arteries bilaterally with fetal type origins of the PCAs. Left P1 segment is hypoplastic. Right P1 segment is either severely hypoplastic or absent. PCAs are otherwise unremarkable.  Internal carotid arteries are patent from skullbase to carotid termini without stenosis. Minimal intracranial ICA calcification is noted. There is a 2 x 2 mm posteroinferiorly directed outpouching from the left supraclinoid ICA at the posterior communicating artery origin. ACAs and MCAs are unremarkable. Specifically, no right MCA aneurysm is identified in the region of greatest hemorrhage in the right sylvian fissure.  Review of the MIP images confirms the above findings.  IMPRESSION: 1. 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 This may be incidental given its location relative to the acute subarachnoid hemorrhage. No right MCA aneurysm identified. 2. No major intracranial arterial occlusion or significant stenosis. 3. No significant interval change in acute subarachnoid hemorrhage and small volume intraventricular hemorrhage.   Electronically Signed   By: ALogan Bores  On: 10/21/2013 16:46   Dg Chest 2 View  10/21/2013   CLINICAL DATA:  Altered level of consciousness, mental status change ; history of TIAs and diabetes  EXAM: CHEST  2 VIEW  COMPARISON:  PA and lateral chest x-ray of December 18, 2008  FINDINGS: The lungs are borderline hypoinflated. There is no focal infiltrate. There is stable subcentimeter parenchymal density in the right mid lung consistent with scarring. There is minimal bibasilar linear increased density consistent with atelectasis. The cardiac silhouette is normal in size. The  pulmonary vascularity is not engorged. The mediastinum is normal in width. There is no pleural effusion. The bony thorax is unremarkable.  IMPRESSION: There is no evidence of pneumonia nor CHF nor other acute cardiopulmonary abnormality. There is minimal bibasilar atelectasis or scarring.   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 Initial encounter.  EXAM: CT HEAD WITHOUT CONTRAST  TECHNIQUE: Contiguous axial images were obtained from the base of the skull through the vertex without intravenous contrast.  COMPARISON:  10/21/2013.  FINDINGS: Visualized paranasal sinuses and mastoids are clear. No acute osseous abnormality identified. No acute orbit or scalp soft tissue findings.  Calcified atherosclerosis at the skull base. Mildly decreased volume of subarachnoid hemorrhage, right sylvian fissure residual. No intraventricular hemorrhage identified today knee. Stable ventricle size and configuration. No midline shift, mass effect, or evidence of intracranial mass lesion. Stable gray-white matter differentiation throughout the brain. No evidence of cortically based acute infarction identified.  IMPRESSION: Slightly decreased volume of subarachnoid hemorrhage. No ventriculomegaly or new intracranial abnormality identified.   Electronically Signed   By: Lars Pinks M.D.   On: 10/22/2013 23:41   Ct Head Wo Contrast  10/21/2013   CLINICAL DATA:  73 year old female with hyperglycemia and altered mental status. History of Alzheimer's.  EXAM: CT HEAD WITHOUT CONTRAST  TECHNIQUE: Contiguous axial images were obtained from the base of the skull through the vertex without intravenous contrast.  COMPARISON:  CT 01/09/2009, 12/23/2008  FINDINGS: Unremarkable appearance of the  calvarium without acute fracture or aggressive lesion. No scalp swelling.  No significant paranasal sinus disease. Mastoid air cells are clear.  Unremarkable appearance of the orbits with bilateral lens extraction.  Multi compartmental acute hemorrhage, with the predominant focus within the right sylvian fissure and right frontal sulci. There also appears to be a small amount of hemorrhage in the right petroclival region and posterior to the right sella turcica. Small focus of subarachnoid hemorrhage in the sulci of the left temporal lobe. There also appears to be a small amount of intraparenchymal hemorrhage versus subarachnoid hemorrhage of the right cerebellar hemisphere.  Hemorrhage layered within the bilateral posterior horns of the lateral ventricles.  Progression of brain volume loss with expansion of the ventricles. Periventricular hypodensity. Intracranial atherosclerosis.  No midline shift.  IMPRESSION: Multi compartmental acute intracranial hemorrhage, with a largest focus in the right sylvian fissure extending superiorly along the sulci and into the right frontal sulci.  Additional foci of subarachnoid hemorrhage include the left temporal sulci, left sylvian fissure, and along the right petroclival ligament. Focus of hemorrhage within the right cerebellar hemisphere may be subarachnoid or intraparenchymal, and there is also blood products layered within the posterior horn the lateral ventricles.  Progression of senescent brain volume loss, periventricular white matter and associated intracranial atherosclerosis.  These results were called by telephone at the time of interpretation on 10/21/2013 at 1:34 pm to Dr. Domenic Moras , who verbally acknowledged these results.  Signed,  Dulcy Fanny. Earleen Newport, DO  [facility]  [facility]   Electronically Signed   By: Corrie Mckusick D.O.   On: 10/21/2013 13:37    ASSESSMENT: 73 y.o. [place] woman with pancytopenia  (chronic moderate thrombocytopenia but new severe leukopenia and anemia), with an MCV of 115 (was 92.09 July 2010); total protein is low, creatinine normal  BLOOD FILM shows no platelet clumps and confirms thrombocytopenia and leukopenia; white cells show no left shift, some polys are bilobed, no hypersegmentation; red cells show rouleaux and anisocytosis but no NRBCs, tailed poikylocytes, schistocytes or polychromasia  PLAN: The differential is broad  and includes termporary marrow suppression from infection or other ingestion, toxicity from medications, nutritional deficiencies, primary marrow problems, and autoimmune conditions. I expect neurology has obtained B-12 levels in past to work up patient's dementia, but as I do not have those results will repeat, together with folate, LDH, reticulocyte count, SPEP, ANA and CRP. Patient has received PRBCs and platelets which may make some of these results hard to interpret.  If the blood work does not provide a clue, and the patient's blood counts do not recover to baseline levels with correction of her acute problems problems, she will need a bone marrow biopsy.   The patient's husband tells me she may be ready for discharge tomorrow (?). If so I will follow her results as outpatient and arrange for any more tests that may be required as well as for hematologic follow up.  Please let me know if I can be of further help.  Chauncey Cruel, MD   10/23/2013 8:13 PM

## 2013-10-23 NOTE — Progress Notes (Signed)
NEURO HOSPITALIST PROGRESS NOTE   SUBJECTIVE:                                                                                                                        Alert and awake, sitting in a couch. Husband at the bedside stated that she is getting back to her baseline. Repeat CT brain 10/21: decreased volume of subarachnoid hemorrhage. No hydrocephalus. Platelets 110 s/p transfusion. Hypernatremia resolved.  OBJECTIVE:                                                                                                                           Vital signs in last 24 hours: Temp:  [97.1 F (36.2 C)-99.1 F (37.3 C)] 97.9 F (36.6 C) (10/22 1309) Pulse Rate:  [81-97] 84 (10/22 1309) Resp:  [12-20] 18 (10/22 1309) BP: (96-117)/(46-68) 117/68 mmHg (10/22 1309) SpO2:  [93 %-100 %] 96 % (10/22 1309)  Intake/Output from previous day: 10/21 0701 - 10/22 0700 In: 895 [P.O.:120; I.V.:775] Out: -  Intake/Output this shift: Total I/O In: 315 [I.V.:250; Blood:65] Out: -  Nutritional status: Dysphagia  Past Medical History  Diagnosis Date  . Diabetes mellitus   . Alzheimer's disease   . Urinary tract infection, site not specified   . Hematuria, unspecified   . Chronic fatigue syndrome   . Alzheimer's dementia   . Colonic dysfunction   . Fibromyalgia   . Anemia   . Polymyalgia rheumatica   . Depression   . Anxiety   . Pancreatic insufficiency   . Unspecified transient cerebral ischemia    Physical exam: awake, no apparent distress.  Head: normocephalic.  Neck: mildly stiff, no bruits, no JVD.  Cardiac: no murmurs.  Lungs: clear.  Abdomen: soft, no tender, no mass.  Extremities: no edema.  Neurologic Exam:  Mental status: awake, confused. Follows some simple commands inconsistently. Says her name, no obvious dysarthria or dysphasia  CN 2-12: pupils seem to be symmetric, EOM appear to be preserved, no nystagmus. Face symmetric. Tongue  midline.  Motor: moves all limbs spontaneously and symmetrically.  Sensory: no tested  DTR's: 1+ all over.  Plantars: left upgoing, right downgoing.  Coordination and gait: unable to test   Lab Results: Lab Results  Component Value Date/Time   CHOL 236* 02/08/2012   Lipid Panel No results found for this basename: CHOL, TRIG, HDL, CHOLHDL, VLDL, LDLCALC,  in the last 72 hours  Studies/Results: Ct Angio Head W/cm &/or Wo Cm  10/21/2013   CLINICAL DATA:  Intracerebral hemorrhage.  EXAM: CT ANGIOGRAPHY HEAD  TECHNIQUE: Multidetector CT imaging of the head was performed using the standard protocol during bolus administration of intravenous contrast. Multiplanar CT image reconstructions and MIPs were obtained to evaluate the vascular anatomy.  CONTRAST:  45mL OMNIPAQUE IOHEXOL 350 MG/ML SOLN  COMPARISON:  Head CT 10/21/2013 and MRA 08/28/2007  FINDINGS: Acute subarachnoid hemorrhage does not appear significantly changed from recent noncontrast head CT, with the greatest volume of hemorrhage present in the right sylvian fissure extending into right frontoparietal sulci. A small amount of hemorrhage is also again seen in left temporal sulci, as well as at the posterior aspect of the right cerebellum. Small amount of intraventricular hemorrhage is again noted in the occipital horns of the lateral ventricles. No abnormal enhancement is identified. There is moderate cerebral atrophy with moderate chronic small vessel ischemic disease. There is no evidence of acute large territory infarct, mass, or midline shift.  Prior bilateral cataract extraction is noted. Mastoid air cells and visualized paranasal sinuses are clear.  Visualized distal vertebral arteries are patent and codominant. Left PICA origin is patent. Right PICA origin is not clearly identified although it may arise from the right AICA, which is patent and dominant. Basilar artery is patent without stenosis. SCA origins are patent. There are medium  sized posterior communicating arteries bilaterally with fetal type origins of the PCAs. Left P1 segment is hypoplastic. Right P1 segment is either severely hypoplastic or absent. PCAs are otherwise unremarkable.  Internal carotid arteries are patent from skullbase to carotid termini without stenosis. Minimal intracranial ICA calcification is noted. There is a 2 x 2 mm posteroinferiorly directed outpouching from the left supraclinoid ICA at the posterior communicating artery origin. ACAs and MCAs are unremarkable. Specifically, no right MCA aneurysm is identified in the region of greatest hemorrhage in the right sylvian fissure.  Review of the MIP images confirms the above findings.  IMPRESSION: 1. 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 This may be incidental given its location relative to the acute subarachnoid hemorrhage. No right MCA aneurysm identified. 2. No major intracranial arterial occlusion or significant stenosis. 3. No significant interval change in acute subarachnoid hemorrhage and small volume intraventricular hemorrhage.   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 Initial encounter.  EXAM: CT HEAD WITHOUT CONTRAST  TECHNIQUE: Contiguous axial images were obtained from the base of the skull through the vertex without intravenous contrast.  COMPARISON:  10/21/2013.  FINDINGS: Visualized paranasal sinuses and mastoids are clear. No acute osseous abnormality identified. No acute orbit or scalp soft tissue findings.  Calcified atherosclerosis at the skull base. Mildly decreased volume of subarachnoid hemorrhage, right sylvian fissure residual. No intraventricular hemorrhage identified today knee. Stable ventricle size and configuration. No midline  shift, mass effect, or evidence of intracranial mass lesion. Stable gray-white matter differentiation throughout the brain. No evidence of cortically based acute infarction identified.  IMPRESSION: Slightly decreased volume of subarachnoid hemorrhage. No ventriculomegaly or new intracranial abnormality identified.  Electronically Signed   By: Lars Pinks M.D.   On: 10/22/2013 23:41    MEDICATIONS                                                                                                                        Scheduled: . antiseptic oral rinse  7 mL Mouth Rinse q12n4p  . chlorhexidine  15 mL Mouth Rinse BID  . citalopram  20 mg Oral Daily  . feeding supplement (GLUCERNA SHAKE)  237 mL Oral TID BM  . hydrocortisone sod succinate (SOLU-CORTEF) inj  50 mg Intravenous Q8H  . [START ON 10/24/2013] Influenza vac split quadrivalent PF  0.5 mL Intramuscular Tomorrow-1000  . insulin aspart  0-9 Units Subcutaneous TID WC  . Memantine HCl ER  28 mg Oral Daily  . rivastigmine  3 mg Oral BID    ASSESSMENT/PLAN:                                                                                                            73 y/o with advanced dementia brought in due to altered mental status for the last 3 or 4 days, most likely multifactorial (metabolic, SAH). Steadily improving. Multifocal SAH: no evidence of MCA aneurysm on CT angio, incidental small 2 mm left Pcom aneurysm that doesn't explain the location of SAH noted on CT brain. She had a recent fall and this could be post traumatic SAH. Platelets normal s/p transfusion. Consider repeating CT brain in 2-3 days.  Dorian Pod, MD [facility] [phone]  10/23/2013, 2:13 PM

## 2013-10-23 NOTE — Progress Notes (Signed)
Occupational Therapy Evaluation Patient Details Name: Jaclyn Hernandez MRN: [id number] DOB: 06-May-1940 Today's Date: 10/23/2013    History of Present Illness 73 y/o with advanced dementia brought in due to altered mental status for the last 3 or 4 days, most likely multifactorial (metabolic, SAH).  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 She had a recent fall and this could be post traumatic SAH.  No hx of prior swallow evals in medical records.  Pt lived at home with family PTA.   Clinical Impression   Family present during session. Husband states pt is close to cognitive baseline but that she is weaker physically. Pt will need SNF for rehab. Educated on role of OT/PT in SNF to facilitate ultimate D/c home.husband verbalized understanding.     Follow Up Recommendations  SNF;Supervision/Assistance - 24 hour    Equipment Recommendations       Recommendations for Other Services       Precautions / Restrictions Precautions Precautions: Fall      Mobility Bed Mobility Overal bed mobility: Needs Assistance;+2 for physical assistance Bed Mobility: Supine to Sit     Supine to sit: Total assist        Transfers   Equipment used:  (face to face gait belt and chuck pad)                  Balance     Sitting balance-Leahy Scale: Zero                                      ADL Overall ADL's : At baseline                                             Vision                     Perception     Praxis      Pertinent Vitals/Pain Pain Assessment: Faces Faces Pain Scale: No hurt     Hand Dominance     Extremity/Trunk Assessment Upper Extremity Assessment Upper Extremity Assessment: Generalized weakness   Lower Extremity Assessment Lower Extremity Assessment: Generalized weakness   Cervical / Trunk  Assessment Cervical / Trunk Assessment: Other exceptions (poor trunk control)   Communication Communication Communication: No difficulties   Cognition Arousal/Alertness: Awake/alert   Overall Cognitive Status: History of cognitive impairments - at baseline                 General Comments: husband feels cogntiion is close to baseline   General Comments       Exercises       Shoulder Instructions      Home Living Family/patient expects to be discharged to:: Skilled nursing facility                                        Prior Functioning/Environment Level of Independence: Needs assistance  Gait / Transfers Assistance Needed: only took a few steps. very sedentary ADL's / Homemaking Assistance Needed: total A        OT  Diagnosis: Generalized weakness;Cognitive deficits   OT Problem List: Decreased strength;Decreased activity tolerance;Impaired balance (sitting and/or standing);Decreased cognition;Decreased coordination;Decreased safety awareness   OT Treatment/Interventions:      OT Goals(Current goals can be found in the care plan section) Acute Rehab OT Goals Patient Stated Goal: none stated  OT Frequency:     Barriers to D/C:            Co-evaluation              End of Session Nurse Communication: Mobility status  Activity Tolerance: Patient tolerated treatment well Patient left: in bed;with call bell/phone within reach;with family/visitor present   Time: 1700-1723 OT Time Calculation (min): 23 min Charges:  OT General Charges $OT Visit: 1 Procedure OT Evaluation $Initial OT Evaluation Tier I: 1 Procedure OT Treatments $Self Care/Home Management : 8-22 mins G-Codes:    Jaclyn Hernandez,Jaclyn Hernandez 24-Oct-2013, 5:37 PM   [facility], OTR/L  [phone] 10/24/2013

## 2013-10-24 DIAGNOSIS — R627 Adult failure to thrive: Secondary | ICD-10-CM

## 2013-10-24 LAB — MAGNESIUM: Magnesium: 2.2 mg/dL (ref 1.5–2.5)

## 2013-10-24 LAB — CBC WITH DIFFERENTIAL/PLATELET
BASOS ABS: 0 10*3/uL (ref 0.0–0.1)
BASOS PCT: 1 % (ref 0–1)
EOS ABS: 0.1 10*3/uL (ref 0.0–0.7)
Eosinophils Relative: 5 % (ref 0–5)
HEMATOCRIT: 29.3 % — AB (ref 36.0–46.0)
Hemoglobin: 10.1 g/dL — ABNORMAL LOW (ref 12.0–15.0)
LYMPHS PCT: 25 % (ref 12–46)
Lymphs Abs: 0.3 10*3/uL — ABNORMAL LOW (ref 0.7–4.0)
MCH: 32.4 pg (ref 26.0–34.0)
MCHC: 34.5 g/dL (ref 30.0–36.0)
MCV: 93.9 fL (ref 78.0–100.0)
MONO ABS: 0 10*3/uL — AB (ref 0.1–1.0)
Monocytes Relative: 1 % — ABNORMAL LOW (ref 3–12)
NEUTROS ABS: 0.6 10*3/uL — AB (ref 1.7–7.7)
Neutrophils Relative %: 68 % (ref 43–77)
Platelets: 144 10*3/uL — ABNORMAL LOW (ref 150–400)
RBC: 3.12 MIL/uL — ABNORMAL LOW (ref 3.87–5.11)
WBC: 1 10*3/uL — CL (ref 4.0–10.5)

## 2013-10-24 LAB — GLUCOSE, CAPILLARY
GLUCOSE-CAPILLARY: 285 mg/dL — AB (ref 70–99)
GLUCOSE-CAPILLARY: 221 mg/dL — AB (ref 70–99)
Glucose-Capillary: 305 mg/dL — ABNORMAL HIGH (ref 70–99)
Glucose-Capillary: 219 mg/dL — ABNORMAL HIGH (ref 70–99)

## 2013-10-24 LAB — COMPREHENSIVE METABOLIC PANEL
ALK PHOS: 69 U/L (ref 39–117)
ALT: 86 U/L — ABNORMAL HIGH (ref 0–35)
AST: 107 U/L — AB (ref 0–37)
Albumin: 2.4 g/dL — ABNORMAL LOW (ref 3.5–5.2)
Anion gap: 12 (ref 5–15)
BILIRUBIN TOTAL: 1.1 mg/dL (ref 0.3–1.2)
BUN: 15 mg/dL (ref 6–23)
CHLORIDE: 103 meq/L (ref 96–112)
CO2: 27 meq/L (ref 19–32)
Calcium: 7.8 mg/dL — ABNORMAL LOW (ref 8.4–10.5)
Creatinine, Ser: 0.47 mg/dL — ABNORMAL LOW (ref 0.50–1.10)
GFR calc Af Amer: >90 mL/min (ref >90)
Glucose, Bld: 235 mg/dL — ABNORMAL HIGH (ref 70–99)
POTASSIUM: 2.8 meq/L — AB (ref 3.7–5.3)
Sodium: 142 mEq/L (ref 137–147)
Total Protein: 6.1 g/dL (ref 6.0–8.3)

## 2013-10-24 LAB — RETICULOCYTES
RBC.: 3.12 MIL/uL — ABNORMAL LOW (ref 3.87–5.11)
RETIC CT PCT: 0.8 % (ref 0.4–3.1)
Retic Count, Absolute: 25 10*3/uL (ref 19.0–186.0)

## 2013-10-24 LAB — FOLATE: FOLATE: 15.3 ng/mL

## 2013-10-24 LAB — VITAMIN B12: Vitamin B-12: 696 pg/mL (ref 211–911)

## 2013-10-24 LAB — LACTATE DEHYDROGENASE: LDH: 670 U/L — ABNORMAL HIGH (ref 94–250)

## 2013-10-24 LAB — PREPARE PLATELET PHERESIS: Unit division: 0

## 2013-10-24 LAB — PROCALCITONIN: Procalcitonin: 0.44 ng/mL

## 2013-10-24 LAB — PATHOLOGIST SMEAR REVIEW

## 2013-10-24 LAB — C-REACTIVE PROTEIN: CRP: 4 mg/dL — AB (ref <0.60)

## 2013-10-24 MED ORDER — INSULIN GLARGINE 100 UNIT/ML ~~LOC~~ SOLN: 12.0000 [IU] | Freq: Every day | SUBCUTANEOUS | Status: DC

## 2013-10-24 MED ORDER — LINAGLIPTIN 5 MG PO TABS
5.0000 mg | ORAL_TABLET | Freq: Every day | ORAL | Status: DC
  Administered 2013-10-24: 5 mg via ORAL
  Filled 2013-10-24: qty 1

## 2013-10-24 MED ORDER — POTASSIUM CHLORIDE 10 MEQ/100ML IV SOLN
10.0000 meq | INTRAVENOUS | Status: AC
  Administered 2013-10-24 (×4): 10 meq via INTRAVENOUS
  Filled 2013-10-24 (×3): qty 100

## 2013-10-24 MED ORDER — POTASSIUM CHLORIDE CRYS ER 20 MEQ PO TBCR
40.0000 meq | EXTENDED_RELEASE_TABLET | Freq: Once | ORAL | Status: AC
  Administered 2013-10-24: 40 meq via ORAL
  Filled 2013-10-24: qty 2

## 2013-10-24 MED ORDER — PANCRELIPASE (LIP-PROT-AMYL) 24000-76000 UNITS PO CPEP: 24000.0000 [IU] | ORAL_CAPSULE | Freq: Two times a day (BID) | ORAL | Status: DC

## 2013-10-24 MED ORDER — PREDNISONE 5 MG PO TABS
15.0000 mg | ORAL_TABLET | Freq: Every day | ORAL | Status: DC
  Administered 2013-10-25: 15 mg via ORAL
  Filled 2013-10-24: qty 1
  Filled 2013-10-24: qty 3

## 2013-10-24 MED ORDER — PANCRELIPASE (LIP-PROT-AMYL) 12000-38000 UNITS PO CPEP
24000.0000 [IU] | ORAL_CAPSULE | Freq: Two times a day (BID) | ORAL | Status: DC
  Administered 2013-10-24 (×2): 24000 [IU] via ORAL
  Filled 2013-10-24 (×4): qty 2

## 2013-10-24 MED ORDER — GLIMEPIRIDE 1 MG PO TABS
1.0000 mg | ORAL_TABLET | Freq: Every day | ORAL | Status: DC
  Administered 2013-10-25: 1 mg via ORAL
  Filled 2013-10-24 (×4): qty 1

## 2013-10-24 MED ORDER — INSULIN GLARGINE 100 UNIT/ML ~~LOC~~ SOLN
10.0000 [IU] | Freq: Every day | SUBCUTANEOUS | Status: DC
  Administered 2013-10-24 – 2013-10-28 (×3): 10 [IU] via SUBCUTANEOUS
  Filled 2013-10-24 (×6): qty 0.1

## 2013-10-24 NOTE — Progress Notes (Signed)
[facility]  Jaclyn Hernandez PPI:[id number] DOB: Jul 01, 1940 DOA: 10/21/2013 PCP: Kevan Ny, MD   Brief narrative: 73 year old BF PMHx diabetes and advanced Alzheimer disease as well as polymyalgia rheumatica. Lives at home with husband, She was found to have progressive altered mentation over the past 24 hours as evidenced by lethargy and inability to eat. At baseline she ambulates with a walker. For the past 2 weeks the family has noted the patient becoming less ambulatory. Over the past 24 hours less responsive and not eating.  Upon arrival to the ER patient underwent a CT of the head which showed an multi-compartmental acute subarachnoid hemorrhages. Neurology was consulted. She was also found to have an elevated blood sugar of 400. Patient was noted to be pancytopenic. She has had chronic thrombocytopenia since 2012 but the leukopenia was new. Her white blood cell count was 700 with a low absolute neutrophil count. In the ER the patient was also noted to be febrile with a temperature of 101.3 and tachycardic. Her sodium was elevated at 149 with elevated ALT and AST as well as mildly elevated creatinine are concerning for volume depletion.  Since admission she has been evaluated by the neurologist. 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 No right MCA aneurysm identified. Her sodium has further increased to 158. Her platelets have dropped to 39,000 and given the acute intracranial bleeding the neurologist has requested that platelets be given. Unfortunately this morning she is alert and has appeared to return to her baseline mentation according to the family.  HPI/Subjective: Patient is at her baseline.  Assessment/Plan:  Acute Subarachnoid hemorrhage Appreciate neurology assistance-has tiny left PCA  aneurysm which according to her neurology is not the etiology for the patient's subarachnoid hemorrhage-family has reported to neurologist recent fall and subarachnoid could be posttraumatic-no MCA aneurysm on CT angio-repeat CT brain 10/21 with decreased volume of subarachnoid hemorrhage/consider repeating prior to discharge  Acute metabolic encephalopathy resolved based on family's report of patient near baseline mentation  Pancytopenia Patient has had low platelets since 2012 with a platelet count at that time of 124,000-in 2013 platelets were down to 60,000-in March of this year platelets 44,000-at presentation platelets were 51,000 and today 39,000 so plan on one unit of platelets-in abscence of clinical evidence to support infection antibiotics were discontinued on 10/21 with plans to follow clincally-mild transaminitis without elevated total bilirubin so doubt hemolysis-concerned patient may have evolving myelodysplastic syndrome -appreciate hematology assistance: Differential includes temporary marrow suppression from infection or ingestion of toxin or medication side effect, nutritional deficit or primary bone marrow suppression or autoimmune condition-labs pending including B12, folate, SPEP, ANA and CRP-patient may require bone marrow biopsy-if no other indications to remain inpatient hematology notes that workup can completed in the outpatient setting/Dr. Magrinat to arrange for outpatient followup  Hypernatremia /Dehydration with mild acute renal failure Resolved-oral intake has improved the plan is to decrease IV fluids to keep a  Diabetes mellitus Was on Amaryl and Januvia pre-admission-CBGs have been consistently greater than 150 since admission-continue sliding scale insulin-hemoglobin A1c 7.3 and given patient's advanced dementia and variable oral intake would be reluctant to pursue very tight glycemic control- stress dose steroids could be influencing current hyperglycemia therefore  sliding scale insulin increased to moderate scale on 10/22-oral intake improved so we resume home Amaryl/Januvia  therapeutically substituted with Tradjenta-MD started Lantus but may only require ST now that stress dose steroids are dc;d  Polymyalgia rheumatica on chronic prednisone Blood pressure had been soft improve -DC stress dose steroids and resume home dose.  Advanced Alzheimer's dementia Continue Celexa, Namenda and Exelon   DVT prophylaxis: SCDs Code Status: DO NOT RESUSCITATE Family Communication: Husband at bedside Disposition Plan/Expected LOS: Transfer to floor but keep on team 1   Consultants: Dr. Armida Sans /Neurology Dr. Fredric Dine /hematology  Procedures: None  Cultures: Urine culture-no growth Blood cultures NGTD  Antibiotics: Cefepime 10/20 >>10/21 Vancomycin 10/20 >>10/21  Objective: Blood pressure 109/62, pulse 89, temperature 98.8 F (37.1 C), temperature source Oral, resp. rate 15, height 5' 1.81" (1.57 m), weight 110 lb 10.7 oz (50.2 kg), SpO2 95.00%.  Intake/Output Summary (Last 24 hours) at 10/24/13 1405 Last data filed at 10/24/13 1300  Gross per 24 hour  Intake   2140 ml  Output      0 ml  Net   2140 ml     Exam: Gen: No acute respiratory distress Chest: Clear to auscultation bilaterally, room air Cardiac: Regular rate and rhythm, S1-S2, no rubs murmurs or gallops, no peripheral edema, no JVD Abdomen: Soft nontender nondistended without obvious hepatosplenomegaly, no ascites Extremities: Symmetrical in appearance without cyanosis, clubbing or effusion  Scheduled Meds:  Scheduled Meds: . antiseptic oral rinse  7 mL Mouth Rinse q12n4p  . chlorhexidine  15 mL Mouth Rinse BID  . citalopram  20 mg Oral Daily  . feeding supplement (GLUCERNA SHAKE)  237 mL Oral TID BM  . [START ON 10/25/2013] glimepiride  1 mg Oral Q breakfast  . Influenza vac split quadrivalent PF  0.5 mL Intramuscular Tomorrow-1000  . insulin aspart  0-15 Units Subcutaneous TID  WC  . insulin aspart  0-5 Units Subcutaneous QHS  . insulin glargine  10 Units Subcutaneous QHS  . linagliptin  5 mg Oral Daily  . lipase/protease/amylase  24,000 Units Oral BID AC  . Memantine HCl ER  28 mg Oral Daily  . [START ON 10/25/2013] predniSONE  15 mg Oral Q breakfast  . rivastigmine  3 mg Oral BID    Data Reviewed: Basic Metabolic Panel:  Recent Labs Lab 10/21/13 1131 10/22/13 0202 10/22/13 1324 10/23/13 0300 10/24/13 0314  NA 149* 158* 156* 146 142  K 4.6 3.9 4.1 3.4* 2.8*  CL 109 121* 117* 111 103  CO2 26 25 24 23 27   GLUCOSE 441* 136* 175* 349* 235*  BUN 42* 29* 28* 22 15  CREATININE 1.15* 0.68 0.64 0.63 0.47*  CALCIUM 8.6 8.4 8.5 7.4* 7.8*  MG  --   --   --   --  2.2   Liver Function Tests:  Recent Labs Lab 10/21/13 1131 10/23/13 0300 10/24/13 0314  AST 119* 107* 107*  ALT 110* 90* 86*  ALKPHOS 77 59 69  BILITOT 0.5 0.3 1.1  PROT 7.3 5.5* 6.1  ALBUMIN 2.9* 2.1* 2.4*   CBC:  Recent Labs Lab 10/21/13 1131 10/22/13 0202 10/23/13 0300 10/24/13 0314  WBC 0.7* 0.9* 0.7* 1.0*  NEUTROABS 0.4*  --  0.5* 0.6*  HGB 9.1* 8.4* 6.7* 10.1*  HCT 28.5* 25.7* 20.1* 29.3*  MCV 114.0* 115.2* 111.0* 93.9  PLT 51* 39* 110* 144*   CBG:  Recent Labs Lab 10/23/13 1245 10/23/13 1701 10/23/13 2144 10/24/13 0740 10/24/13 1232  GLUCAP 267* 246* 270* 305* 285*    Recent Results (from the past 240 hour(s))  URINE CULTURE  Status: None   Collection Time    10/21/13 11:35 AM      Result Value Ref Range Status   Specimen Description URINE, RANDOM   Final   Special Requests ADDED [id number]   Final   Culture  Setup Time     Final   Value: 10/21/2013 17:42     Performed at [facility]     Final   Value: NO GROWTH     Performed at [facility]   Culture     Final   Value: NO GROWTH     Performed at [facility]   Report Status 10/22/2013 FINAL   Final  CULTURE, BLOOD (ROUTINE X 2)     Status: None    Collection Time    10/21/13 12:00 PM      Result Value Ref Range Status   Specimen Description BLOOD RIGHT ANTECUBITAL   Final   Special Requests BOTTLES DRAWN AEROBIC AND ANAEROBIC 5CC   Final   Culture  Setup Time     Final   Value: 10/21/2013 17:33     Performed at [facility]   Culture     Final   Value:        BLOOD CULTURE RECEIVED NO GROWTH TO DATE CULTURE WILL BE HELD FOR 5 DAYS BEFORE ISSUING A FINAL NEGATIVE REPORT     Performed at [facility]   Report Status PENDING   Incomplete  CULTURE, BLOOD (ROUTINE X 2)     Status: None   Collection Time    10/21/13 12:08 PM      Result Value Ref Range Status   Specimen Description BLOOD HAND RIGHT   Final   Special Requests BOTTLES DRAWN AEROBIC ONLY 5CC   Final   Culture  Setup Time     Final   Value: 10/21/2013 17:33     Performed at [facility]   Culture     Final   Value:        BLOOD CULTURE RECEIVED NO GROWTH TO DATE CULTURE WILL BE HELD FOR 5 DAYS BEFORE ISSUING A FINAL NEGATIVE REPORT     Performed at [facility]   Report Status PENDING   Incomplete  MRSA PCR SCREENING     Status: None   Collection Time    10/22/13 10:12 AM      Result Value Ref Range Status   MRSA by PCR NEGATIVE  NEGATIVE Final   Comment:            The GeneXpert MRSA Assay (FDA     approved for NASAL specimens     only), is one component of a     comprehensive MRSA colonization     surveillance program. It is not     intended to diagnose MRSA     infection nor to guide or     monitor treatment for     MRSA infections.     Studies:  Recent x-ray studies have been reviewed in detail by the Attending Physician  Time spent :  [place], ANP [facility] Office  [phone] Pager [phone]   **If unable to reach the above provider after paging please contact the [facility] @ [phone]  On-Call/Text Page:      [email or web address]      password [email or web address]  If 7PM-7AM, please contact  night-coverage [email or web address] Password [email or web address] 10/24/2013, 2:05 PM  LOS: 3 days    Addendum  I personally saw and evaluated patient on 10/24/2013 and agree with the above findings. Patient is a pleasant 73 year old with a past medical history of type 2 diabetes mellitus, dementia, admitted to the medicine service on 10/21/2013 as she presented with acute encephalopathy, functional decline/failure to thrive. Initial workup included a CT scan of head without contrast performed on 10/21/2013 which showed multi-compartmental acute intracranial hemorrhage. Dr. Armida Sans of neurology was consulted. She was found to be thrombocytopenic with a platelet count coming down to 39,000 on 10/22/2013. She was transfused with platelets. During this hospitalization she was evaluated by Dr. Jana Hakim of medical oncology.  Labs reviewed, platelet count increasing 244,000 today. Remains hypokalemic for which she was given additional potassium replacement. Patient remains confused and disoriented, cannot tell me where she was or what town or city she was in. Otherwise she was awake and alert and could follow commands. Transferring to floor today.

## 2013-10-24 NOTE — Progress Notes (Signed)
Medicare Important Message given? YES   (If response is "NO", the following Medicare IM given date fields will be blank)   Date Medicare IM given:   Medicare IM given by: Graves[place]Bigelow, Lashaunta Sicard  

## 2013-10-24 NOTE — Clinical Social Work Placement (Addendum)
Clinical Social Work Department CLINICAL SOCIAL WORK PLACEMENT NOTE 10/24/2013  Patient:  Jaclyn Hernandez, Jaclyn Hernandez  Account Number:  [id number] Admit date:  10/21/2013  Clinical Social Worker:  Greta Doom, LCSWA  Date/time:  10/23/2013 04:13 PM  Clinical Social Work is seeking post-discharge placement for this patient at the following level of care:   SKILLED NURSING   (*CSW will update this form in Epic as items are completed)   10/23/2013  Patient/family provided with [facility] Department of Clinical Social Work's list of facilities offering this level of care within the geographic area requested by the patient (or if unable, by the patient's family).  10/23/2013  Patient/family informed of their freedom to choose among providers that offer the needed level of care, that participate in Medicare, Medicaid or managed care program needed by the patient, have an available bed and are willing to accept the patient.  10/23/2013  Patient/family informed of [facility]' ownership interest in [facility], as well as of the fact that they are under no obligation to receive care at this facility.  PASARR submitted to EDS on 10/24/2013 PASARR number received on 10/24/2013  FL2 transmitted to all facilities in geographic area requested by pt/family on  10/24/2013 FL2 transmitted to all facilities within larger geographic area on 10/24/2013  Patient informed that his/her managed care company has contracts with or will negotiate with  certain facilities, including the following:     Patient/family informed of bed offers received:  10/24/2013 Patient chooses bed at [facility] Physician recommends and patient chooses bed at    Patient to be transferred to  on   Patient to be transferred to facility by  Patient and family notified of transfer on  Name of family member notified:    The following physician request were entered in Epic:   Additional  Comments:   [place], MSW, [place]

## 2013-10-24 NOTE — Clinical Social Work Psychosocial (Signed)
Clinical Social Work Department BRIEF PSYCHOSOCIAL ASSESSMENT 10/24/2013  Patient:  Jaclyn Hernandez, Jaclyn Hernandez     Account Number:  [id number]     Admit date:  10/21/2013  Clinical Social Worker:  Marciano Sequin  Date/Time:  10/23/2013 04:00 PM  Referred by:  RN  Date Referred:  10/23/2013 Referred for  SNF Placement   Other Referral:   Interview type:  Family Other interview type:   Pt is not oreinted; Jaclyn Hernandez [phone]    PSYCHOSOCIAL DATA Living Status:  HUSBAND Admitted from facility:   Level of care:   Primary support name:  Jaclyn Hernandez Primary support relationship to patient:  SPOUSE Degree of support available:   Strong Support System    CURRENT CONCERNS Current Concerns  None Noted   Other Concerns:    SOCIAL WORK ASSESSMENT / PLAN CSW met with the pt's husband Jaclyn Hernandez at the bedside. CSW introduced self and purpose of visit. CSW and Jaclyn Hernandez discussed clinical recommendations for SNF. Jaclyn Hernandez was receptive to the clinical recommendations for SNF. Jaclyn Hernandez indicated knowing that the pt needs additional help before she can come home.  Jaclyn Hernandez appeared content while reviewing and discussing SNFs in [facility]. CSW and Jaclyn Hernandez discussed the importance of touring the SNFs he expressed interested in.  CSW will continue to follow and provided support to the pt and family.   Assessment/plan status:  Psychosocial Support/Ongoing Assessment of Needs Other assessment/ plan:   Information/referral to community resources:    PATIENT'S/FAMILY'S RESPONSE TO PLAN OF CARE: It appeared that Jaclyn Hernandez understands his wife condition and the importanance of SNF placement. Jaclyn Hernandez expressed wanting additonal rehab for his wife before she can return home.   [place], MSW, [place]

## 2013-10-24 NOTE — Progress Notes (Signed)
CRITICAL VALUE ALERT  Critical value received:  Potassium of 2.8  Date of notification:  10/24/13  Time of notification:  5053  Critical value read back:Yes.    Nurse who received alert:  Toney Sang  MD notified (1st page):  Forrest Moron   Time of first page:  [phone]  MD notified (2nd page):  Time of second page:  Responding MD:  Forrest Moron   Time MD responded:  [phone], orders put in

## 2013-10-24 NOTE — Progress Notes (Addendum)
Inpatient Diabetes Program Recommendations  AACE/ADA: New Consensus Statement on Inpatient Glycemic Control (2013)  Target Ranges:  Prepandial:   less than 140 mg/dL      Peak postprandial:   less than 180 mg/dL (1-2 hours)      Critically ill patients:  140 - 180 mg/dL   Reason for Visit: Hyperglycemia  Results for Jaclyn Hernandez, Jaclyn Hernandez (MRN [id number]) as of 10/24/2013 12:16  Ref. Range 10/23/2013 07:29 10/23/2013 12:45 10/23/2013 17:01 10/23/2013 21:44 10/24/2013 07:40  Glucose-Capillary Latest Range: 70-99 mg/dL 251 (H) 267 (H) 246 (H) 270 (H) 305 (H)    Transitioned off GlucoStabilizer to Novolog moderate tidwc and hs. Needs basal insulin.  Steroids decreased - On Prednisone 10 mg QD. HgbA1C - 7.3%. Doubtful this is accurate with low H/H.  Recommendations: Add Lantus 10 units Q24H Increase Novolog to resistant tidwc and hs.  Will continue to follow. Thank you. Lorenda Peck, RD, LDN, CDE Inpatient Diabetes Coordinator [phone]

## 2013-10-24 NOTE — Progress Notes (Signed)
Pt to [place] to 4N-26, VSS, called report. Called husband to notify of [place].

## 2013-10-25 ENCOUNTER — Inpatient Hospital Stay (HOSPITAL_COMMUNITY): Payer: Medicare Other

## 2013-10-25 LAB — CBC WITH DIFFERENTIAL/PLATELET
BASOS PCT: 1 % (ref 0–1)
Basophils Absolute: 0 10*3/uL (ref 0.0–0.1)
EOS PCT: 2 % (ref 0–5)
Eosinophils Absolute: 0 10*3/uL (ref 0.0–0.7)
HEMATOCRIT: 32.9 % — AB (ref 36.0–46.0)
HEMOGLOBIN: 11.5 g/dL — AB (ref 12.0–15.0)
Lymphocytes Relative: 30 % (ref 12–46)
Lymphs Abs: 0.3 10*3/uL — ABNORMAL LOW (ref 0.7–4.0)
MCH: 32.3 pg (ref 26.0–34.0)
MCHC: 35 g/dL (ref 30.0–36.0)
MCV: 92.4 fL (ref 78.0–100.0)
Monocytes Absolute: 0 10*3/uL — ABNORMAL LOW (ref 0.1–1.0)
Monocytes Relative: 2 % — ABNORMAL LOW (ref 3–12)
Neutro Abs: 0.6 10*3/uL — ABNORMAL LOW (ref 1.7–7.7)
Neutrophils Relative %: 65 % (ref 43–77)
Platelets: 96 10*3/uL — ABNORMAL LOW (ref 150–400)
RBC: 3.56 MIL/uL — ABNORMAL LOW (ref 3.87–5.11)
RDW: 23 % — ABNORMAL HIGH (ref 11.5–15.5)
WBC: 0.9 10*3/uL — CL (ref 4.0–10.5)

## 2013-10-25 LAB — BASIC METABOLIC PANEL
ANION GAP: 10 (ref 5–15)
BUN: 12 mg/dL (ref 6–23)
CALCIUM: 8 mg/dL — AB (ref 8.4–10.5)
CHLORIDE: 96 meq/L (ref 96–112)
CO2: 28 meq/L (ref 19–32)
Creatinine, Ser: 0.49 mg/dL — ABNORMAL LOW (ref 0.50–1.10)
GFR calc Af Amer: >90 mL/min (ref >90)
GFR calc non Af Amer: >90 mL/min (ref >90)
Glucose, Bld: 210 mg/dL — ABNORMAL HIGH (ref 70–99)
Potassium: 3.3 mEq/L — ABNORMAL LOW (ref 3.7–5.3)
SODIUM: 134 meq/L — AB (ref 137–147)

## 2013-10-25 LAB — CBC
HEMATOCRIT: 33.3 % — AB (ref 36.0–46.0)
Hemoglobin: 11.3 g/dL — ABNORMAL LOW (ref 12.0–15.0)
MCH: 32.1 pg (ref 26.0–34.0)
MCHC: 33.9 g/dL (ref 30.0–36.0)
MCV: 94.6 fL (ref 78.0–100.0)
Platelets: 76 10*3/uL — ABNORMAL LOW (ref 150–400)
RBC: 3.52 MIL/uL — ABNORMAL LOW (ref 3.87–5.11)
RDW: 23.6 % — ABNORMAL HIGH (ref 11.5–15.5)
WBC: 0.7 10*3/uL — AB (ref 4.0–10.5)

## 2013-10-25 LAB — URINALYSIS, ROUTINE W REFLEX MICROSCOPIC
Bilirubin Urine: NEGATIVE
GLUCOSE, UA: 500 mg/dL — AB
KETONES UR: NEGATIVE mg/dL
Leukocytes, UA: NEGATIVE
Nitrite: NEGATIVE
PH: 7 (ref 5.0–8.0)
Protein, ur: 30 mg/dL — AB
Specific Gravity, Urine: 1.014 (ref 1.005–1.030)
Urobilinogen, UA: 1 mg/dL (ref 0.0–1.0)

## 2013-10-25 LAB — GLUCOSE, CAPILLARY
GLUCOSE-CAPILLARY: 191 mg/dL — AB (ref 70–99)
Glucose-Capillary: 207 mg/dL — ABNORMAL HIGH (ref 70–99)
Glucose-Capillary: 190 mg/dL — ABNORMAL HIGH (ref 70–99)
Glucose-Capillary: 194 mg/dL — ABNORMAL HIGH (ref 70–99)

## 2013-10-25 LAB — URINE MICROSCOPIC-ADD ON

## 2013-10-25 MED ORDER — SODIUM CHLORIDE 0.9 % IV SOLN
INTRAVENOUS | Status: DC
  Administered 2013-10-25 – 2013-10-27 (×3): via INTRAVENOUS
  Administered 2013-10-28: 1000 mL via INTRAVENOUS

## 2013-10-25 MED ORDER — POTASSIUM CHLORIDE 10 MEQ/100ML IV SOLN
10.0000 meq | INTRAVENOUS | Status: AC
  Administered 2013-10-25 (×2): 10 meq via INTRAVENOUS
  Filled 2013-10-25: qty 100

## 2013-10-25 NOTE — Progress Notes (Signed)
Jaclyn Hernandez  Jaclyn Hernandez JAS:[id number] DOB: December 05, 1940 DOA: 10/21/2013 PCP: Kevan Ny, MD   Brief narrative: 73 year old BF PMHx diabetes and advanced Alzheimer disease as well as polymyalgia rheumatica. Lives at home with husband, She was found to have progressive altered mentation over the past 24 hours as evidenced by lethargy and inability to eat. At baseline she ambulates with a walker. For the past 2 weeks the family has noted the patient becoming less ambulatory. Over the past 24 hours less responsive and not eating.  Upon arrival to the ER patient underwent a CT of the head which showed an multi-compartmental acute subarachnoid hemorrhages. Neurology was consulted. She was also found to have an elevated blood sugar of 400. Patient was noted to be pancytopenic. She has had chronic thrombocytopenia since 2012 but the leukopenia was new. Her white blood cell count was 700 with a low absolute neutrophil count. In the ER the patient was also noted to be febrile with a temperature of 101.3 and tachycardic. Her sodium was elevated at 149 with elevated ALT and AST as well as mildly elevated creatinine are concerning for volume depletion.  Since admission she has been evaluated by the neurologist. 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 No right MCA aneurysm identified. Her sodium has further increased to 158. Her platelets have dropped to 39,000 and given the acute intracranial bleeding the neurologist has requested that platelets be given. Unfortunately this morning she is alert and has appeared to return to her baseline mentation according to the family.  On 10/25/2013 patient having a steep functional decline, difficult to arouse or follow commands. She was not taking by mouth. She remained  hemodynamically stable, on exam she was moving all extremities. She was worked up with urinalysis and chest x-ray which did not reveal obvious infection. A CT scan of her brain showed little change in appearance of subarachnoid hemorrhage.                                                                                                                                                                        HPI/Subjective: Patient is lethargic, difficult to arouse or have her follow commands. Family members are present at bedside  Assessment/Plan:  Acute Subarachnoid hemorrhage Appreciate neurology assistance-has tiny left PCA aneurysm which according to her neurology is not the etiology for the patient's subarachnoid hemorrhage-family has reported to neurologist recent fall and subarachnoid could be posttraumatic-no MCA aneurysm on CT angio-repeat CT brain 10/21 with decreased volume of subarachnoid hemorrhage/consider repeating prior to discharge She  had a repeat CT scan of brain performed on 10/25/2013 which showed little change in appearance of subarachnoid hemorrhage at the right sylvian fissure, no new intraparenchymal abnormalities. Urinalysis and chest x-ray were unrevealing. RN reporting patient had not received sedatives overnight. Case discussed with neurology, Dr Aram Beecham felt that this could be related to subclinical seizures and recommended EEG.   Acute metabolic encephalopathy As mentioned above, patient having a steep functional decline, nonverbal, unable to follow commands. Workup including UA and chest x-ray which were unrevealing. Repeat CT scan of brain showed little change in appearance of subarachnoid hemorrhage. I discussed case with neurology who felt this could be subclinical seizures given presence of intracranial hemorrhage. Dr Aram Beecham recommended obtaining an EEG. For now will hold off on antimicrobial therapy unless she spikes a fever. Followup on blood cultures.    Pancytopenia Patient has had low platelets since 2012 with a platelet count at that time of 124,000-in 2013 platelets were down to 60,000-in March of this year platelets 44,000-at presentation platelets were 51,000 and today 39,000 so plan on one unit of platelets-in abscence of clinical evidence to support infection antibiotics were discontinued on 10/21 with plans to follow clincally-mild transaminitis without elevated total bilirubin so doubt hemolysis-concerned patient may have evolving myelodysplastic syndrome -appreciate hematology assistance: Differential includes temporary marrow suppression from infection or ingestion of toxin or medication side effect, nutritional deficit or primary bone marrow suppression or autoimmune condition-labs pending including B12, folate, SPEP, ANA and CRP-patient may require bone marrow biopsy-if no other indications to remain inpatient hematology notes that workup can completed in the outpatient setting/Dr. Magrinat to arrange for outpatient followup  Hypernatremia /Dehydration with mild acute renal failure Resolved-oral intake has improved the plan is to decrease IV fluids to keep a  Diabetes mellitus Was on Amaryl and Januvia pre-admission-CBGs have been consistently greater than 150 since admission-continue sliding scale insulin-hemoglobin A1c 7.3 and given patient's advanced dementia and variable oral intake would be reluctant to pursue very tight glycemic control- stress dose steroids could be influencing current hyperglycemia therefore sliding scale insulin increased to moderate scale on 10/22-oral intake improved so we resume home Amaryl/Januvia therapeutically substituted with Tradjenta-MD started Lantus but may only require ST now that stress dose steroids are dc;d  Polymyalgia rheumatica on chronic prednisone Blood pressure had been soft improve -DC stress dose steroids and resume home dose.  Advanced Alzheimer's dementia Continue Celexa, Namenda and  Exelon   DVT prophylaxis: SCDs Code Status: DO NOT RESUSCITATE Family Communication: I spoke with Husband and sister Disposition Plan/Expected LOS: Transfer to floor but keep on team 1   Consultants: Dr. Armida Sans /Neurology Dr. Fredric Dine /hematology  Procedures: None  Cultures: Urine culture-no growth Blood cultures NGTD  Antibiotics: Cefepime 10/20 >>10/21 Vancomycin 10/20 >>10/21  Objective: Blood pressure 103/60, pulse 95, temperature 99.3 F (37.4 C), temperature source Axillary, resp. rate 16, height 5' (1.524 m), weight 51.6 kg (113 lb 12.1 oz), SpO2 100.00%. No intake or output data in the 24 hours ending 10/25/13 1641   Exam: Gen: Patient is lethargic, difficult to arouse, presently nonverbal Chest: Clear to auscultation bilaterally, no wheezing rhonchi or rales Cardiac: Regular rate and rhythm, S1-S2, no rubs murmurs or gallops, no peripheral edema, no JVD Abdomen: Soft nontender nondistended without obvious hepatosplenomegaly, no ascites Extremities: Symmetrical in appearance without cyanosis, clubbing or effusion Neurological: Patient had difficulty participating in neurologic exam, she appears to be moving all extremities.  Scheduled Meds:  Scheduled Meds: . antiseptic oral rinse  7 mL  Mouth Rinse q12n4p  . chlorhexidine  15 mL Mouth Rinse BID  . citalopram  20 mg Oral Daily  . feeding supplement (GLUCERNA SHAKE)  237 mL Oral TID BM  . glimepiride  1 mg Oral Q breakfast  . Influenza vac split quadrivalent PF  0.5 mL Intramuscular Tomorrow-1000  . insulin aspart  0-15 Units Subcutaneous TID WC  . insulin aspart  0-5 Units Subcutaneous QHS  . insulin glargine  10 Units Subcutaneous QHS  . linagliptin  5 mg Oral Daily  . lipase/protease/amylase  24,000 Units Oral BID AC  . Memantine HCl ER  28 mg Oral Daily  . potassium chloride  10 mEq Intravenous Q1 Hr x 2  . predniSONE  15 mg Oral Q breakfast  . rivastigmine  3 mg Oral BID    Data Reviewed: Basic  Metabolic Panel:  Recent Labs Lab 10/22/13 0202 10/22/13 1324 10/23/13 0300 10/24/13 0314 10/25/13 0524  NA 158* 156* 146 142 134*  K 3.9 4.1 3.4* 2.8* 3.3*  CL 121* 117* 111 103 96  CO2 _0 GLUCOSE 136* 175* 349* 235* 210*  BUN 29* 28* _1 CREATININE 0.68 0.64 0.63 0.47* 0.49*  CALCIUM 8.4 8.5 7.4* 7.8* 8.0*  MG  --   --   --  2.2  --    Liver Function Tests:  Recent Labs Lab 10/21/13 1131 10/23/13 0300 10/24/13 0314  AST 119* 107* 107*  ALT 110* 90* 86*  ALKPHOS 77 59 69  BILITOT 0.5 0.3 1.1  PROT 7.3 5.5* 6.1  ALBUMIN 2.9* 2.1* 2.4*   CBC:  Recent Labs Lab 10/21/13 1131 10/22/13 0202 10/23/13 0300 10/24/13 0314 10/25/13 0524 10/25/13 1142  WBC 0.7* 0.9* 0.7* 1.0* 0.7* 0.9*  NEUTROABS 0.4*  --  0.5* 0.6*  --  0.6*  HGB 9.1* 8.4* 6.7* 10.1* 11.3* 11.5*  HCT 28.5* 25.7* 20.1* 29.3* 33.3* 32.9*  MCV 114.0* 115.2* 111.0* 93.9 94.6 92.4  PLT 51* 39* 110* 144* 76* 96*   CBG:  Recent Labs Lab 10/24/13 1625 10/24/13 2138 10/25/13 0705 10/25/13 1111 10/25/13 1618  GLUCAP 219* 221* 207* 191* 190*    Recent Results (from the past 240 hour(s))  URINE CULTURE     Status: None   Collection Time    10/21/13 11:35 AM      Result Value Ref Range Status   Specimen Description URINE, RANDOM   Final   Special Requests ADDED [id number]   Final   Culture  Setup Time     Final   Value: 10/21/2013 17:42     Performed at [place]     Final   Value: NO GROWTH     Performed at [facility]   Culture     Final   Value: NO GROWTH     Performed at [facility]   Report Status 10/22/2013 FINAL   Final  CULTURE, BLOOD (ROUTINE X 2)     Status: None   Collection Time    10/21/13 12:00 PM      Result Value Ref Range Status   Specimen Description BLOOD RIGHT ANTECUBITAL   Final   Special Requests BOTTLES DRAWN AEROBIC AND ANAEROBIC 5CC   Final   Culture  Setup Time     Final   Value: 10/21/2013 17:33      Performed at [facility]   Culture     Final   Value:  BLOOD CULTURE RECEIVED NO GROWTH TO DATE CULTURE WILL BE HELD FOR 5 DAYS BEFORE ISSUING A FINAL NEGATIVE REPORT     Performed at [facility]   Report Status PENDING   Incomplete  CULTURE, BLOOD (ROUTINE X 2)     Status: None   Collection Time    10/21/13 12:08 PM      Result Value Ref Range Status   Specimen Description BLOOD HAND RIGHT   Final   Special Requests BOTTLES DRAWN AEROBIC ONLY 5CC   Final   Culture  Setup Time     Final   Value: 10/21/2013 17:33     Performed at [facility]   Culture     Final   Value:        BLOOD CULTURE RECEIVED NO GROWTH TO DATE CULTURE WILL BE HELD FOR 5 DAYS BEFORE ISSUING A FINAL NEGATIVE REPORT     Performed at [facility]   Report Status PENDING   Incomplete  MRSA PCR SCREENING     Status: None   Collection Time    10/22/13 10:12 AM      Result Value Ref Range Status   MRSA by PCR NEGATIVE  NEGATIVE Final   Comment:            The GeneXpert MRSA Assay (FDA     approved for NASAL specimens     only), is one component of a     comprehensive MRSA colonization     surveillance program. It is not     intended to diagnose MRSA     infection nor to guide or     monitor treatment for     MRSA infections.     Studies:   Time spent :  35 mins     **If unable to reach the above provider after paging please contact the Flow Manager @ [phone]  On-Call/Text Page:      [email or web address]      password [email or web address]  If 7PM-7AM, please contact night-coverage [email or web address] Password [email or web address] 10/25/2013, 4:41 PM   LOS: 4 days

## 2013-10-25 NOTE — Progress Notes (Signed)
Patient incontinent of urine; orders for ua; in and out cath. Using sterile technique; specimen sent to the lab.

## 2013-10-25 NOTE — Progress Notes (Signed)
Patient remains drowsy; unable to take po's safely; family at bedside aware of aspiration precautions; staff only to assist patient until more alert.

## 2013-10-26 DIAGNOSIS — E860 Dehydration: Secondary | ICD-10-CM

## 2013-10-26 LAB — CBC
HEMATOCRIT: 33.2 % — AB (ref 36.0–46.0)
Hemoglobin: 11.3 g/dL — ABNORMAL LOW (ref 12.0–15.0)
MCH: 32.2 pg (ref 26.0–34.0)
MCHC: 34 g/dL (ref 30.0–36.0)
MCV: 94.6 fL (ref 78.0–100.0)
Platelets: 50 10*3/uL — ABNORMAL LOW (ref 150–400)
RBC: 3.51 MIL/uL — ABNORMAL LOW (ref 3.87–5.11)
RDW: 22.4 % — AB (ref 11.5–15.5)
WBC: 0.8 10*3/uL — CL (ref 4.0–10.5)

## 2013-10-26 LAB — BASIC METABOLIC PANEL
ANION GAP: 12 (ref 5–15)
BUN: 13 mg/dL (ref 6–23)
CO2: 23 mEq/L (ref 19–32)
CREATININE: 0.45 mg/dL — AB (ref 0.50–1.10)
Calcium: 7.9 mg/dL — ABNORMAL LOW (ref 8.4–10.5)
Chloride: 99 mEq/L (ref 96–112)
GFR calc Af Amer: >90 mL/min (ref >90)
Glucose, Bld: 193 mg/dL — ABNORMAL HIGH (ref 70–99)
POTASSIUM: 4.3 meq/L (ref 3.7–5.3)
Sodium: 134 mEq/L — ABNORMAL LOW (ref 137–147)

## 2013-10-26 LAB — TYPE AND SCREEN
ABO/RH(D): O POS
ANTIBODY SCREEN: NEGATIVE
UNIT DIVISION: 0
UNIT DIVISION: 0
Unit division: 0
Unit division: 0
Unit division: 0

## 2013-10-26 LAB — GLUCOSE, CAPILLARY
GLUCOSE-CAPILLARY: 162 mg/dL — AB (ref 70–99)
GLUCOSE-CAPILLARY: 190 mg/dL — AB (ref 70–99)
Glucose-Capillary: 201 mg/dL — ABNORMAL HIGH (ref 70–99)
Glucose-Capillary: 237 mg/dL — ABNORMAL HIGH (ref 70–99)
Glucose-Capillary: 146 mg/dL — ABNORMAL HIGH (ref 70–99)

## 2013-10-26 LAB — METHYLMALONIC ACID, SERUM: Methylmalonic Acid, Quantitative: 371 nmol/L — ABNORMAL HIGH (ref 87–318)

## 2013-10-26 LAB — INTRINSIC FACTOR ANTIBODIES: Intrinsic Factor: NEGATIVE

## 2013-10-26 MED ORDER — INSULIN ASPART 100 UNIT/ML ~~LOC~~ SOLN
0.0000 [IU] | SUBCUTANEOUS | Status: DC
  Administered 2013-10-26: 1 [IU] via SUBCUTANEOUS
  Administered 2013-10-26 – 2013-10-27 (×3): 2 [IU] via SUBCUTANEOUS
  Administered 2013-10-28: 1 [IU] via SUBCUTANEOUS

## 2013-10-26 MED ORDER — VANCOMYCIN HCL IN DEXTROSE 750-5 MG/150ML-% IV SOLN
750.0000 mg | INTRAVENOUS | Status: DC
  Administered 2013-10-27 – 2013-10-28 (×2): 750 mg via INTRAVENOUS
  Filled 2013-10-26 (×3): qty 150

## 2013-10-26 MED ORDER — DEXTROSE 5 % IV SOLN
2.0000 g | Freq: Two times a day (BID) | INTRAVENOUS | Status: DC
  Administered 2013-10-26 – 2013-10-29 (×6): 2 g via INTRAVENOUS
  Filled 2013-10-26 (×8): qty 2

## 2013-10-26 MED ORDER — VANCOMYCIN HCL 10 G IV SOLR
1250.0000 mg | Freq: Once | INTRAVENOUS | Status: AC
Start: 2013-10-26 — End: 2013-10-26
  Administered 2013-10-26: 1250 mg via INTRAVENOUS
  Filled 2013-10-26: qty 1250

## 2013-10-26 NOTE — Progress Notes (Signed)
Patient ID: Jaclyn Hernandez  female  UDJ:[id number]    DOB: [birth year]    DOA: 10/21/2013  PCP: Kevan Ny, MD  Brief narrative:  73 year old BF PMHx diabetes and advanced Alzheimer disease as well as polymyalgia rheumatica. Lives at home with husband, She was found to have progressive altered mentation, lethargy and inability to eat. At baseline she ambulates with a walker. For the past 2 weeks the family has noted the patient becoming less ambulatory. Over the past 24 hours prior to admission, she became less responsive and not eating.  Upon arrival to the ER patient underwent a CT of the head which showed an multi-compartmental acute subarachnoid hemorrhages. Neurology was consulted. She was also found to have an elevated blood sugar of 400. Patient was noted to be pancytopenic. She has had chronic thrombocytopenia since 2012 but the leukopenia was new. Her white blood cell count was 700 with a low absolute neutrophil count. In the ER the patient was also noted to be febrile with a temperature of 101.3 and tachycardic. Her sodium was elevated at 149 with elevated ALT and AST, mildly elevated creatinine are concerning for volume depletion.   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 No right MCA aneurysm identified.  On 10/21 Patient developed hypernatremia Na 158, PLT dropped to 39,000, given the acute intracranial bleeding, patient was transfused platelets pheresis. Patient subsequently became alert and returned to her baseline mental status. She was transferred out to the floor.   On 10/25/2013 patient had again steep functional decline, difficult to arouse or follow commands, not taking PO, vitals signs remained stable. On exam she was moving all extremities. UA and chest x-ray did not reveal  infection. CT scan of her brain showed no significant change in appearance of SAH. Neurology was reconsulted and recommended EEG.     Assessment/Plan: Principal Problem:   Acute encephalopathy - Patient has history of advanced dementia, brought in due to acute encephalopathy, metabolic with subarachnoid hemorrhage - Chest x-ray UA negative repeat CT of the brain showed no significant change in appearance of [facility] - Neurology consulted, recommended EEG to exclude subclinical seizures - Follow blood cultures  Active Problems:   SAH (subarachnoid hemorrhage) - Appreciate neurology assistance-has tiny left PCA aneurysm which according to neurology is not the etiology for the patient's subarachnoid hemorrhage - Family had reported recent fall and SAH could be posttraumatic-no MCA aneurysm on CT angio-repeat CT brain 10/21 with decreased volume of SAH - repeat CT scan 10/25/2013 showed little change in appearance of subarachnoid hemorrhage at the right sylvian fissure, no new intraparenchymal abnormalities.   Pancytopenia  - History of chronic thrombocytopenia since 2012 , in March of this year platelets 44,000 - Patient received 1 unit of plateletpheresis during hospitalization - patient may have evolving myelodysplastic syndrome, hematology was consulted. Per hematology, may require bone marrow biopsy, workup can completed in the outpatient setting/Dr. Magrinat to arrange for outpatient followup - will transfuse platelets less than 50K  Hypernatremia /Dehydration with mild acute renal failure  Resolved   Diabetes mellitus  Was on Amaryl and Januvia pre-admission-continue sliding scale insulin-hemoglobin A1c 7.3  Polymyalgia rheumatica on chronic prednisone  Continue home dose prednisone   Advanced Alzheimer's dementia  Continue Celexa, Namenda and Exelon   DVT Prophylaxis:  Code Status:  Family  Communication:  Disposition:  Consultants:  Neurology  Hematology  Procedures:  None  Antibiotics:  None  Subjective: Patient seen and examined, arousable to voice and pain stimuli, able to say her name and okay, not following any verbal commands  Objective: Weight change:  No intake or output data in the 24 hours ending 10/26/13 1007 Blood pressure 108/61, pulse 107, temperature 101.1 F (38.4 C), temperature source Axillary, resp. rate 18, height 5' (1.524 m), weight 51.6 kg (113 lb 12.1 oz), SpO2 92.00%.  Physical Exam: General: Oriented to self CVS: S1-S2 clear, no murmur rubs or gallops Chest: clear to auscultation bilaterally Abdomen: soft nontender, nondistended, normal bowel sounds  Extremities: no cyanosis, clubbing or edema noted bilaterally Neuro: Not following any commands  Lab Results: Basic Metabolic Panel:  Recent Labs Lab 10/24/13 0314 10/25/13 0524 10/26/13 0515  NA 142 134* 134*  K 2.8* 3.3* 4.3  CL 103 96 99  CO2 27 28 23   GLUCOSE 235* 210* 193*  BUN 15 12 13   CREATININE 0.47* 0.49* 0.45*  CALCIUM 7.8* 8.0* 7.9*  MG 2.2  --   --    Liver Function Tests:  Recent Labs Lab 10/23/13 0300 10/24/13 0314  AST 107* 107*  ALT 90* 86*  ALKPHOS 59 69  BILITOT 0.3 1.1  PROT 5.5* 6.1  ALBUMIN 2.1* 2.4*   No results found for this basename: LIPASE, AMYLASE,  in the last 168 hours No results found for this basename: AMMONIA,  in the last 168 hours CBC:  Recent Labs Lab 10/25/13 1142 10/26/13 0515  WBC 0.9* 0.8*  NEUTROABS 0.6*  --   HGB 11.5* 11.3*  HCT 32.9* 33.2*  MCV 92.4 94.6  PLT 96* 50*   Cardiac Enzymes: No results found for this basename: CKTOTAL, CKMB, CKMBINDEX, TROPONINI,  in the last 168 hours BNP: No components found with this basename: POCBNP,  CBG:  Recent Labs Lab 10/25/13 0705 10/25/13 1111 10/25/13 1618 10/25/13 2134 10/26/13 0659  GLUCAP 207* 191* 190* 194* 201*     Micro Results: Recent Results  (from the past 240 hour(s))  URINE CULTURE     Status: None   Collection Time    10/21/13 11:35 AM      Result Value Ref Range Status   Specimen Description URINE, RANDOM   Final   Special Requests ADDED [id number]   Final   Culture  Setup Time     Final   Value: 10/21/2013 17:42     Performed at [facility]     Final   Value: NO GROWTH     Performed at [facility]   Culture     Final   Value: NO GROWTH     Performed at [facility]   Report Status 10/22/2013 FINAL   Final  CULTURE, BLOOD (ROUTINE X 2)     Status: None   Collection Time    10/21/13 12:00 PM      Result Value Ref Range Status   Specimen Description BLOOD RIGHT ANTECUBITAL   Final   Special Requests BOTTLES DRAWN AEROBIC AND ANAEROBIC 5CC   Final   Culture  Setup Time     Final   Value: 10/21/2013 17:33     Performed at [facility]   Culture     Final   Value:        BLOOD CULTURE RECEIVED NO GROWTH TO DATE CULTURE WILL BE HELD FOR 5 DAYS BEFORE ISSUING A FINAL NEGATIVE REPORT  Performed at [facility]   Report Status PENDING   Incomplete  CULTURE, BLOOD (ROUTINE X 2)     Status: None   Collection Time    10/21/13 12:08 PM      Result Value Ref Range Status   Specimen Description BLOOD HAND RIGHT   Final   Special Requests BOTTLES DRAWN AEROBIC ONLY 5CC   Final   Culture  Setup Time     Final   Value: 10/21/2013 17:33     Performed at [facility]   Culture     Final   Value:        BLOOD CULTURE RECEIVED NO GROWTH TO DATE CULTURE WILL BE HELD FOR 5 DAYS BEFORE ISSUING A FINAL NEGATIVE REPORT     Performed at [facility]   Report Status PENDING   Incomplete  MRSA PCR SCREENING     Status: None   Collection Time    10/22/13 10:12 AM      Result Value Ref Range Status   MRSA by PCR NEGATIVE  NEGATIVE Final   Comment:            The GeneXpert MRSA Assay (FDA     approved for NASAL specimens     only), is one component of a      comprehensive MRSA colonization     surveillance program. It is not     intended to diagnose MRSA     infection nor to guide or     monitor treatment for     MRSA infections.    Studies/Results: Ct Angio Head W/cm &/or Wo Cm  10/21/2013   CLINICAL DATA:  Intracerebral hemorrhage.  EXAM: CT ANGIOGRAPHY HEAD  TECHNIQUE: Multidetector CT imaging of the head was performed using the standard protocol during bolus administration of intravenous contrast. Multiplanar CT image reconstructions and MIPs were obtained to evaluate the vascular anatomy.  CONTRAST:  49m OMNIPAQUE IOHEXOL 350 MG/ML SOLN  COMPARISON:  Head CT 10/21/2013 and MRA 08/28/2007  FINDINGS: Acute subarachnoid hemorrhage does not appear significantly changed from recent noncontrast head CT, with the greatest volume of hemorrhage present in the right sylvian fissure extending into right frontoparietal sulci. A small amount of hemorrhage is also again seen in left temporal sulci, as well as at the posterior aspect of the right cerebellum. Small amount of intraventricular hemorrhage is again noted in the occipital horns of the lateral ventricles. No abnormal enhancement is identified. There is moderate cerebral atrophy with moderate chronic small vessel ischemic disease. There is no evidence of acute large territory infarct, mass, or midline shift.  Prior bilateral cataract extraction is noted. Mastoid air cells and visualized paranasal sinuses are clear.  Visualized distal vertebral arteries are patent and codominant. Left PICA origin is patent. Right PICA origin is not clearly identified although it may arise from the right AICA, which is patent and dominant. Basilar artery is patent without stenosis. SCA origins are patent. There are medium sized posterior communicating arteries bilaterally with fetal type origins of the PCAs. Left P1 segment is hypoplastic. Right P1 segment is either severely hypoplastic or absent. PCAs are otherwise  unremarkable.  Internal carotid arteries are patent from skullbase to carotid termini without stenosis. Minimal intracranial ICA calcification is noted. There is a 2 x 2 mm posteroinferiorly directed outpouching from the left supraclinoid ICA at the posterior communicating artery origin. ACAs and MCAs are unremarkable. Specifically, no right MCA aneurysm is identified in the region of greatest hemorrhage  in the right sylvian fissure.  Review of the MIP images confirms the above findings.  IMPRESSION: 1. 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 This may be incidental given its location relative to the acute subarachnoid hemorrhage. No right MCA aneurysm identified. 2. No major intracranial arterial occlusion or significant stenosis. 3. No significant interval change in acute subarachnoid hemorrhage and small volume intraventricular hemorrhage.   Electronically Signed   By: Logan Bores   On: 10/21/2013 16:46   Dg Chest 2 View  10/25/2013   CLINICAL DATA:  Fever, weakness, unable to hold arms up, personal history of diabetes mellitus, Alzheimer's  EXAM: CHEST  2 VIEW  COMPARISON:  10/21/2013  FINDINGS: Normal heart size, mediastinal contours, and pulmonary vascularity.  Bronchitic changes with minimal bibasilar atelectasis.  Lungs otherwise clear.  No pleural effusion or pneumothorax.  Bones demineralized.  IMPRESSION: Bronchitic changes with bibasilar atelectasis.   Electronically Signed   By: Lavonia Dana M.D.   On: 10/25/2013 13:57   Dg Chest 2 View  10/21/2013   CLINICAL DATA:  Altered level of consciousness, mental status change ; history of TIAs and diabetes  EXAM: CHEST  2 VIEW  COMPARISON:  PA and lateral chest x-ray of December 18, 2008  FINDINGS: The lungs are borderline hypoinflated. There is no focal infiltrate. There is stable subcentimeter parenchymal density in the right mid lung consistent with scarring. There is minimal bibasilar linear increased density consistent with atelectasis.  The cardiac silhouette is normal in size. The pulmonary vascularity is not engorged. The mediastinum is normal in width. There is no pleural effusion. The bony thorax is unremarkable.  IMPRESSION: There is no evidence of pneumonia nor CHF nor other acute cardiopulmonary abnormality. There is minimal bibasilar atelectasis or scarring.   Electronically Signed   By: David  Martinique   On: 10/21/2013 13:25   Ct Head Wo Contrast  10/25/2013   CLINICAL DATA:  Followup acute encephalopathy, recent intracranial hemorrhage, decline in mental status today, history Alzheimer's disease, anterior communicating artery aneurysm, diabetes mellitus  EXAM: CT HEAD WITHOUT CONTRAST  TECHNIQUE: Contiguous axial images were obtained from the base of the skull through the vertex without intravenous contrast.  COMPARISON:  10/22/2013 for an  FINDINGS: Generalized atrophy.  Normal ventricular morphology.  No midline shift or mass effect.  Small vessel chronic ischemic changes of deep cerebral white matter.  Subarachnoid hemorrhage again identified within the RIGHT sylvian fissure and within a few sulci at the posterior parietal lobes bilaterally, LEFT temporal lobe, and RIGHT frontal lobe.  No definite intraparenchymal hemorrhage, mass lesion, or areas of infarction.  Tiny amount of dependent intraventricular blood at the occipital horn of the RIGHT lateral ventricle.  Bones and sinuses unremarkable.  IMPRESSION: Little change in appearance of subarachnoid hemorrhage at the RIGHT sylvian fissure with additional areas of mildscattered subarachnoid blood at additional sites bilaterally.  Minimal dependent intraventricular blood at the occipital horn of the RIGHT lateral ventricle.  No new intraparenchymal abnormalities.   Electronically Signed   By: Lavonia Dana M.D.   On: 10/25/2013 13:56   Ct Head Wo Contrast  10/22/2013   CLINICAL DATA:  73 year old female with altered mental status, found to have acute intracranial hemorrhage on  10/2013, and left posterior communicating artery aneurysm by CTA that day. Initial encounter.  EXAM: CT HEAD WITHOUT CONTRAST  TECHNIQUE: Contiguous axial images were obtained from the base of the skull through the vertex without intravenous contrast.  COMPARISON:  10/21/2013.  FINDINGS: Visualized paranasal  sinuses and mastoids are clear. No acute osseous abnormality identified. No acute orbit or scalp soft tissue findings.  Calcified atherosclerosis at the skull base. Mildly decreased volume of subarachnoid hemorrhage, right sylvian fissure residual. No intraventricular hemorrhage identified today knee. Stable ventricle size and configuration. No midline shift, mass effect, or evidence of intracranial mass lesion. Stable gray-white matter differentiation throughout the brain. No evidence of cortically based acute infarction identified.  IMPRESSION: Slightly decreased volume of subarachnoid hemorrhage. No ventriculomegaly or new intracranial abnormality identified.   Electronically Signed   By: Lars Pinks M.D.   On: 10/22/2013 23:41   Ct Head Wo Contrast  10/21/2013   CLINICAL DATA:  73 year old female with hyperglycemia and altered mental status. History of Alzheimer's.  EXAM: CT HEAD WITHOUT CONTRAST  TECHNIQUE: Contiguous axial images were obtained from the base of the skull through the vertex without intravenous contrast.  COMPARISON:  CT 01/09/2009, 12/23/2008  FINDINGS: Unremarkable appearance of the calvarium without acute fracture or aggressive lesion. No scalp swelling.  No significant paranasal sinus disease. Mastoid air cells are clear.  Unremarkable appearance of the orbits with bilateral lens extraction.  Multi compartmental acute hemorrhage, with the predominant focus within the right sylvian fissure and right frontal sulci. There also appears to be a small amount of hemorrhage in the right petroclival region and posterior to the right sella turcica. Small focus of subarachnoid hemorrhage in the  sulci of the left temporal lobe. There also appears to be a small amount of intraparenchymal hemorrhage versus subarachnoid hemorrhage of the right cerebellar hemisphere.  Hemorrhage layered within the bilateral posterior horns of the lateral ventricles.  Progression of brain volume loss with expansion of the ventricles. Periventricular hypodensity. Intracranial atherosclerosis.  No midline shift.  IMPRESSION: Multi compartmental acute intracranial hemorrhage, with a largest focus in the right sylvian fissure extending superiorly along the sulci and into the right frontal sulci.  Additional foci of subarachnoid hemorrhage include the left temporal sulci, left sylvian fissure, and along the right petroclival ligament. Focus of hemorrhage within the right cerebellar hemisphere may be subarachnoid or intraparenchymal, and there is also blood products layered within the posterior horn the lateral ventricles.  Progression of senescent brain volume loss, periventricular white matter and associated intracranial atherosclerosis.  These results were called by telephone at the time of interpretation on 10/21/2013 at 1:34 pm to Dr. Domenic Moras , who verbally acknowledged these results.  Signed,  Dulcy Fanny. Earleen Newport, DO  [facility]  [facility]   Electronically Signed   By: Corrie Mckusick D.O.   On: 10/21/2013 13:37    Medications: Scheduled Meds: . antiseptic oral rinse  7 mL Mouth Rinse q12n4p  . chlorhexidine  15 mL Mouth Rinse BID  . citalopram  20 mg Oral Daily  . feeding supplement (GLUCERNA SHAKE)  237 mL Oral TID BM  . Influenza vac split quadrivalent PF  0.5 mL Intramuscular Tomorrow-1000  . insulin aspart  0-15 Units Subcutaneous TID WC  . insulin aspart  0-5 Units Subcutaneous QHS  . insulin glargine  10 Units Subcutaneous QHS  . lipase/protease/amylase  24,000 Units Oral BID AC  . Memantine HCl ER  28 mg Oral Daily  . predniSONE  15 mg Oral Q breakfast  .  rivastigmine  3 mg Oral BID      LOS: 5 days   RAI,RIPUDEEP M.D. [facility] 10/26/2013, 10:07 AM Pager: [phone]  If 7PM-7AM, please contact night-coverage [email or web address] Password [email or web address]

## 2013-10-26 NOTE — Progress Notes (Addendum)
Continues with lethargy; now with temp. 102.8 F; MD notified; pharmacy consulted.

## 2013-10-26 NOTE — Progress Notes (Addendum)
NEURO HOSPITALIST PROGRESS NOTE   SUBJECTIVE:                                                                                                                        She says " I am okay" but doesn't open her eyes and is less responsive than usual. Repeat CT yesterday is not significantly change. Except for pancytopenia, serologies and UA are not impressive. Afebrile.   OBJECTIVE:                                                                                                                           Vital signs in last 24 hours: Temp:  [97.7 F (36.5 C)-100.2 F (37.9 C)] 97.7 F (36.5 C) (10/25 0601) Pulse Rate:  [88-109] 106 (10/25 0601) Resp:  [15-20] 18 (10/25 0601) BP: (94-126)/(56-89) 94/59 mmHg (10/25 0601) SpO2:  [97 %-100 %] 99 % (10/25 0601)  Intake/Output from previous day:   Intake/Output this shift:   Nutritional status: Dysphagia  Past Medical History  Diagnosis Date  . Diabetes mellitus   . Alzheimer's disease   . Urinary tract infection, site not specified   . Hematuria, unspecified   . Chronic fatigue syndrome   . Alzheimer's dementia   . Colonic dysfunction   . Fibromyalgia   . Anemia   . Polymyalgia rheumatica   . Depression   . Anxiety   . Pancreatic insufficiency   . Unspecified transient cerebral ischemia    Physical exam: no apparent distress.  Head: normocephalic.  Neck: mildly stiff, no bruits, no JVD.  Cardiac: no murmurs.  Lungs: clear.  Abdomen: soft, no tender, no mass.  Extremities: no edema LE.    Neurologic Exam:  Mental status: she is not opening her eyes or following commands. Says she is okay but doesn't engage in conversation. CN 2-12: pupils seem to be symmetric, EOM appear to be preserved, no nystagmus. Face symmetric. Tongue midline.  Motor: minimal motor spontaneous movements.  Sensory: reacts to pain DTR's: 1+ all over.  Plantars: left upgoing, right downgoing.  Coordination  and gait: unable to test No meningeal irritation signs.   Lab Results: Lab Results  Component Value Date/Time   CHOL 236* 02/08/2012   Lipid Panel No  results found for this basename: CHOL, TRIG, HDL, CHOLHDL, VLDL, LDLCALC,  in the last 72 hours  Studies/Results: Dg Chest 2 View  10/25/2013   CLINICAL DATA:  Fever, weakness, unable to hold arms up, personal history of diabetes mellitus, Alzheimer's  EXAM: CHEST  2 VIEW  COMPARISON:  10/21/2013  FINDINGS: Normal heart size, mediastinal contours, and pulmonary vascularity.  Bronchitic changes with minimal bibasilar atelectasis.  Lungs otherwise clear.  No pleural effusion or pneumothorax.  Bones demineralized.  IMPRESSION: Bronchitic changes with bibasilar atelectasis.   Electronically Signed   By: Lavonia Dana M.D.   On: 10/25/2013 13:57   Ct Head Wo Contrast  10/25/2013   CLINICAL DATA:  Followup acute encephalopathy, recent intracranial hemorrhage, decline in mental status today, history Alzheimer's disease, anterior communicating artery aneurysm, diabetes mellitus  EXAM: CT HEAD WITHOUT CONTRAST  TECHNIQUE: Contiguous axial images were obtained from the base of the skull through the vertex without intravenous contrast.  COMPARISON:  10/22/2013 for an  FINDINGS: Generalized atrophy.  Normal ventricular morphology.  No midline shift or mass effect.  Small vessel chronic ischemic changes of deep cerebral white matter.  Subarachnoid hemorrhage again identified within the RIGHT sylvian fissure and within a few sulci at the posterior parietal lobes bilaterally, LEFT temporal lobe, and RIGHT frontal lobe.  No definite intraparenchymal hemorrhage, mass lesion, or areas of infarction.  Tiny amount of dependent intraventricular blood at the occipital horn of the RIGHT lateral ventricle.  Bones and sinuses unremarkable.  IMPRESSION: Little change in appearance of subarachnoid hemorrhage at the RIGHT sylvian fissure with additional areas of mildscattered  subarachnoid blood at additional sites bilaterally.  Minimal dependent intraventricular blood at the occipital horn of the RIGHT lateral ventricle.  No new intraparenchymal abnormalities.   Electronically Signed   By: Lavonia Dana M.D.   On: 10/25/2013 13:56    MEDICATIONS                                                                                                                        Scheduled: . antiseptic oral rinse  7 mL Mouth Rinse q12n4p  . chlorhexidine  15 mL Mouth Rinse BID  . citalopram  20 mg Oral Daily  . feeding supplement (GLUCERNA SHAKE)  237 mL Oral TID BM  . Influenza vac split quadrivalent PF  0.5 mL Intramuscular Tomorrow-1000  . insulin aspart  0-15 Units Subcutaneous TID WC  . insulin aspart  0-5 Units Subcutaneous QHS  . insulin glargine  10 Units Subcutaneous QHS  . lipase/protease/amylase  24,000 Units Oral BID AC  . Memantine HCl ER  28 mg Oral Daily  . predniSONE  15 mg Oral Q breakfast  . rivastigmine  3 mg Oral BID    ASSESSMENT/PLAN:  73 y/o with advanced dementia brought in due to altered mental status most likely multifactorial (metabolic, SAH).Multifocal SAH: no evidence of MCA aneurysm on CT angio, incidental small 2 mm left Pcom aneurysm that doesn't explain the location of SAH noted on CT brain.   Did relatively well until yesterday when was noted to be less responsive. Metabolic derangements corrected and no evidence of ongoing infection. CT brain yesterday was essentially unchanged. EEG to exclude the possibility of subclinical seizures. Will follow up.  Dorian Pod, MD [facility] [phone]  10/26/2013, 8:49 AM

## 2013-10-26 NOTE — Progress Notes (Addendum)
ANTIBIOTIC CONSULT NOTE - INITIAL  Pharmacy Consult for Cefepime/Vancomycin Indication: Sepsis  Allergies  Allergen Reactions  . Sulfa Antibiotics Other (See Comments)    unknown   Vital Signs: Temp: 102.8 F (39.3 C) (10/25 1753) Temp Source: Axillary (10/25 1753) BP: 123/61 mmHg (10/25 1753) Pulse Rate: 109 (10/25 1753)  Labs:  Recent Labs  10/24/13 0314 10/25/13 0524 10/25/13 1142 10/26/13 0515  WBC 1.0* 0.7* 0.9* 0.8*  HGB 10.1* 11.3* 11.5* 11.3*  PLT 144* 76* 96* 50*  CREATININE 0.47* 0.49*  --  0.45*   Estimated Creatinine Clearance: 43.6 ml/min (by C-G formula based on Cr of 0.45). No results found for this basename: VANCOTROUGH, Corlis Leak, VANCORANDOM, [place], [place], [place], [place], [place], TOBRARND, AMIKACINPEAK, AMIKACINTROU, AMIKACIN,  in the last 72 hours   Microbiology: Recent Results (from the past 720 hour(s))  URINE CULTURE     Status: None   Collection Time    10/21/13 11:35 AM      Result Value Ref Range Status   Specimen Description URINE, RANDOM   Final   Special Requests ADDED [id number]   Final   Culture  Setup Time     Final   Value: 10/21/2013 17:42     Performed at [facility]     Final   Value: NO GROWTH     Performed at [facility]   Culture     Final   Value: NO GROWTH     Performed at [facility]   Report Status 10/22/2013 FINAL   Final  CULTURE, BLOOD (ROUTINE X 2)     Status: None   Collection Time    10/21/13 12:00 PM      Result Value Ref Range Status   Specimen Description BLOOD RIGHT ANTECUBITAL   Final   Special Requests BOTTLES DRAWN AEROBIC AND ANAEROBIC 5CC   Final   Culture  Setup Time     Final   Value: 10/21/2013 17:33     Performed at [facility]   Culture     Final   Value:        BLOOD CULTURE RECEIVED NO GROWTH TO DATE CULTURE WILL BE HELD FOR 5 DAYS BEFORE ISSUING A FINAL NEGATIVE REPORT     Performed at [facility]   Report  Status PENDING   Incomplete  CULTURE, BLOOD (ROUTINE X 2)     Status: None   Collection Time    10/21/13 12:08 PM      Result Value Ref Range Status   Specimen Description BLOOD HAND RIGHT   Final   Special Requests BOTTLES DRAWN AEROBIC ONLY 5CC   Final   Culture  Setup Time     Final   Value: 10/21/2013 17:33     Performed at [facility]   Culture     Final   Value:        BLOOD CULTURE RECEIVED NO GROWTH TO DATE CULTURE WILL BE HELD FOR 5 DAYS BEFORE ISSUING A FINAL NEGATIVE REPORT     Performed at [facility]   Report Status PENDING   Incomplete  MRSA PCR SCREENING     Status: None   Collection Time    10/22/13 10:12 AM      Result Value Ref Range Status   MRSA by PCR NEGATIVE  NEGATIVE Final   Comment:            The GeneXpert MRSA Assay (FDA  approved for NASAL specimens     only), is one component of a     comprehensive MRSA colonization     surveillance program. It is not     intended to diagnose MRSA     infection nor to guide or     monitor treatment for     MRSA infections.  CULTURE, BLOOD (ROUTINE X 2)     Status: None   Collection Time    10/25/13 11:35 AM      Result Value Ref Range Status   Specimen Description BLOOD RIGHT HAND   Final   Special Requests BOTTLES DRAWN AEROBIC AND ANAEROBIC 10CC EACH   Final   Culture  Setup Time     Final   Value: 10/25/2013 18:04     Performed at [facility]   Culture     Final   Value:        BLOOD CULTURE RECEIVED NO GROWTH TO DATE CULTURE WILL BE HELD FOR 5 DAYS BEFORE ISSUING A FINAL NEGATIVE REPORT     Performed at [facility]   Report Status PENDING   Incomplete  CULTURE, BLOOD (ROUTINE X 2)     Status: None   Collection Time    10/25/13  1:40 PM      Result Value Ref Range Status   Specimen Description BLOOD RIGHT HAND   Final   Special Requests BOTTLES DRAWN AEROBIC ONLY 3CC   Final   Culture  Setup Time     Final   Value: 10/25/2013 18:13     Performed at [facility]   Culture     Final   Value:        BLOOD CULTURE RECEIVED NO GROWTH TO DATE CULTURE WILL BE HELD FOR 5 DAYS BEFORE ISSUING A FINAL NEGATIVE REPORT     Performed at [facility]   Report Status PENDING   Incomplete    Medical History: Past Medical History  Diagnosis Date  . Diabetes mellitus   . Alzheimer's disease   . Urinary tract infection, site not specified   . Hematuria, unspecified   . Chronic fatigue syndrome   . Alzheimer's dementia   . Colonic dysfunction   . Fibromyalgia   . Anemia   . Polymyalgia rheumatica   . Depression   . Anxiety   . Pancreatic insufficiency   . Unspecified transient cerebral ischemia    Assessment: 73 y/o F w/ AMS and hyperglycemia.  Hx of alzheimer's but usually awake and talking. She was found to have subarachnoid hemorrhage. So far all of her cultures have remained neg. She was on vanc and cefepime a few days ago. We are starting vanc and cefepime now for febrile neutropenia.   Goal of Therapy:  Vancomycin trough 15-20  Plan:   Vanc 1.25g IV x1 then 750mg  IV q24 Cefepime 2g IV q12 Level in a few days if needed  Onnie Boer, PharmD Pager: [phone] 10/26/2013 6:02 PM

## 2013-10-27 ENCOUNTER — Inpatient Hospital Stay (HOSPITAL_COMMUNITY): Payer: Medicare Other

## 2013-10-27 DIAGNOSIS — E46 Unspecified protein-calorie malnutrition: Secondary | ICD-10-CM

## 2013-10-27 DIAGNOSIS — Z66 Do not resuscitate: Secondary | ICD-10-CM

## 2013-10-27 DIAGNOSIS — Z515 Encounter for palliative care: Secondary | ICD-10-CM

## 2013-10-27 DIAGNOSIS — R531 Weakness: Secondary | ICD-10-CM

## 2013-10-27 DIAGNOSIS — D696 Thrombocytopenia, unspecified: Secondary | ICD-10-CM

## 2013-10-27 LAB — CULTURE, BLOOD (ROUTINE X 2)
CULTURE: NO GROWTH
Culture: NO GROWTH

## 2013-10-27 LAB — GLUCOSE, CAPILLARY
GLUCOSE-CAPILLARY: 113 mg/dL — AB (ref 70–99)
GLUCOSE-CAPILLARY: 156 mg/dL — AB (ref 70–99)
Glucose-Capillary: 102 mg/dL — ABNORMAL HIGH (ref 70–99)
Glucose-Capillary: 107 mg/dL — ABNORMAL HIGH (ref 70–99)
Glucose-Capillary: 132 mg/dL — ABNORMAL HIGH (ref 70–99)
Glucose-Capillary: 96 mg/dL (ref 70–99)
Glucose-Capillary: 99 mg/dL (ref 70–99)

## 2013-10-27 LAB — ANA: Anti Nuclear Antibody(ANA): NEGATIVE

## 2013-10-27 LAB — KAPPA/LAMBDA LIGHT CHAINS
KAPPA, LAMDA LIGHT CHAIN RATIO: 1.07 (ref 0.26–1.65)
Kappa free light chain: 1.92 mg/dL (ref 0.33–1.94)
Lambda free light chains: 1.79 mg/dL (ref 0.57–2.63)

## 2013-10-27 LAB — BASIC METABOLIC PANEL
ANION GAP: 12 (ref 5–15)
BUN: 10 mg/dL (ref 6–23)
CHLORIDE: 103 meq/L (ref 96–112)
CO2: 23 mEq/L (ref 19–32)
Calcium: 7.4 mg/dL — ABNORMAL LOW (ref 8.4–10.5)
Creatinine, Ser: 0.42 mg/dL — ABNORMAL LOW (ref 0.50–1.10)
GFR calc Af Amer: >90 mL/min (ref >90)
GFR calc non Af Amer: >90 mL/min (ref >90)
Glucose, Bld: 101 mg/dL — ABNORMAL HIGH (ref 70–99)
Potassium: 3.3 mEq/L — ABNORMAL LOW (ref 3.7–5.3)
Sodium: 138 mEq/L (ref 137–147)

## 2013-10-27 LAB — CBC
HCT: 32.2 % — ABNORMAL LOW (ref 36.0–46.0)
HEMOGLOBIN: 10.6 g/dL — AB (ref 12.0–15.0)
MCH: 32 pg (ref 26.0–34.0)
MCHC: 32.9 g/dL (ref 30.0–36.0)
MCV: 97.3 fL (ref 78.0–100.0)
Platelets: 25 10*3/uL — CL (ref 150–400)
RBC: 3.31 MIL/uL — AB (ref 3.87–5.11)
RDW: 21.9 % — ABNORMAL HIGH (ref 11.5–15.5)
WBC: 0.7 10*3/uL — CL (ref 4.0–10.5)

## 2013-10-27 LAB — TYPE AND SCREEN
ABO/RH(D): O POS
Antibody Screen: NEGATIVE

## 2013-10-27 MED ORDER — SODIUM CHLORIDE 0.9 % IV SOLN
Freq: Once | INTRAVENOUS | Status: AC
  Administered 2013-10-27: 10:00:00 via INTRAVENOUS

## 2013-10-27 MED ORDER — POTASSIUM CHLORIDE 10 MEQ/100ML IV SOLN
10.0000 meq | INTRAVENOUS | Status: AC
  Administered 2013-10-27 (×2): 10 meq via INTRAVENOUS
  Filled 2013-10-27 (×2): qty 100

## 2013-10-27 MED ORDER — POTASSIUM CHLORIDE 10 MEQ/100ML IV SOLN
10.0000 meq | INTRAVENOUS | Status: AC
  Administered 2013-10-27: 10 meq via INTRAVENOUS
  Filled 2013-10-27: qty 100

## 2013-10-27 NOTE — Progress Notes (Signed)
Critical Platelet count received of 25.  MD made aware. Cori Razor, RN

## 2013-10-27 NOTE — Progress Notes (Signed)
Jaclyn Hernandez   DOB:10/01/1940   MR#:[id number]   CSN#:[id number]  Patient Care Team: Eric L Dean, MD as PCP - General (Internal Medicine)  I have seen the patient, examined her and edited the notes as follows. Requested by Dr. Magrinat to take over her care  Subjective: Patient asleep; does not respond to verbal or tactile stimuli at this time. Husband at bedside, reports she has not been able to have oral intake over the last 2 days due to lack of response. No other new issues reported. Afebrile. Pancytopenia continues to be present, with a decline in numbers, without bleeding issues. EEG pending. Repeat CT 10/24 shows no significant change. Serologies and urinalysis unremarkable.     Scheduled Meds: . sodium chloride   Intravenous Once  . antiseptic oral rinse  7 mL Mouth Rinse q12n4p  . ceFEPime (MAXIPIME) IV  2 g Intravenous Q12H  . chlorhexidine  15 mL Mouth Rinse BID  . citalopram  20 mg Oral Daily  . feeding supplement (GLUCERNA SHAKE)  237 mL Oral TID BM  . Influenza vac split quadrivalent PF  0.5 mL Intramuscular Tomorrow-1000  . insulin aspart  0-9 Units Subcutaneous 6 times per day  . insulin glargine  10 Units Subcutaneous QHS  . lipase/protease/amylase  24,000 Units Oral BID AC  . Memantine HCl ER  28 mg Oral Daily  . potassium chloride  10 mEq Intravenous Q1 Hr x 3  . predniSONE  15 mg Oral Q breakfast  . rivastigmine  3 mg Oral BID  . vancomycin  750 mg Intravenous Q24H   Continuous Infusions: . sodium chloride 100 mL/hr at 10/26/13 1802   PRN Meds:acetaminophen, acetaminophen, albuterol, dextrose   Objective:  Filed Vitals:   10/27/13 0600  BP: 107/64  Pulse: 75  Temp: 100.2 F (37.9 C)  Resp: 20     No intake or output data in the 24 hours ending 10/27/13 0903  ECOG PERFORMANCE STATUS:4  GENERAL eyes closed, not responsive to tactile or verbal stimuli.  SKIN: skin color, texture, turgor are normal, no rashes or significant lesions LUNGS: clear to  auscultation and percussion with normal breathing effort HEART: regular rate & rhythm and no murmurs and no lower extremity edema ABDOMEN:abdomen soft, non-tender and normal bowel sounds Musculoskeletal:no cyanosis of digits and no clubbing  PSYCH: alert & oriented x 3 with fluent speech NEURO: no focal motor/sensory deficits    CBG (last 3)   Recent Labs  10/27/13 0407 10/27/13 0645 10/27/13 0808  GLUCAP 107* 99 96     Labs:   Recent Labs Lab 10/21/13 1131  10/23/13 0300 10/24/13 0314 10/25/13 0524 10/25/13 1142 10/26/13 0515 10/27/13 0548  WBC 0.7*  < > 0.7* 1.0* 0.7* 0.9* 0.8* PENDING  HGB 9.1*  < > 6.7* 10.1* 11.3* 11.5* 11.3* 10.6*  HCT 28.5*  < > 20.1* 29.3* 33.3* 32.9* 33.2* 32.2*  PLT 51*  < > 110* 144* 76* 96* 50* PENDING  MCV 114.0*  < > 111.0* 93.9 94.6 92.4 94.6 97.3  MCH 36.4*  < > 37.0* 32.4 32.1 32.3 32.2 32.0  MCHC 31.9  < > 33.3 34.5 33.9 35.0 34.0 32.9  RDW 16.8*  < > 16.3* NOT CALCULATED 23.6* 23.0* 22.4* 21.9*  LYMPHSABS 0.3*  --  0.2* 0.3*  --  0.3*  --   --   MONOABS 0.0*  --  0.0* 0.0*  --  0.0*  --   --   EOSABS 0.0  --  0.0 0.1  --    0.0  --   --   BASOSABS 0.0  --  0.0 0.0  --  0.0  --   --   < > = values in this interval not displayed.   Chemistries:    Recent Labs Lab 10/21/13 1131  10/23/13 0300 10/24/13 0314 10/25/13 0524 10/26/13 0515 10/27/13 0548  NA 149*  < > 146 142 134* 134* 138  K 4.6  < > 3.4* 2.8* 3.3* 4.3 3.3*  CL 109  < > 111 103 96 99 103  CO2 26  < > _0 GLUCOSE 441*  < > 349* 235* 210* 193* 101*  BUN 42*  < > _1 CREATININE 1.15*  < > 0.63 0.47* 0.49* 0.45* 0.42*  CALCIUM 8.6  < > 7.4* 7.8* 8.0* 7.9* 7.4*  MG  --   --   --  2.2  --   --   --   AST 119*  --  107* 107*  --   --   --   ALT 110*  --  90* 86*  --   --   --   ALKPHOS 77  --  59 69  --   --   --   BILITOT 0.5  --  0.3 1.1  --   --   --   < > = values in this interval not displayed.  GFR Estimated Creatinine Clearance:  43.6 ml/min (by C-G formula based on Cr of 0.42).  Liver Function Tests:  Recent Labs Lab 10/21/13 1131 10/23/13 0300 10/24/13 0314  AST 119* 107* 107*  ALT 110* 90* 86*  ALKPHOS 77 59 69  BILITOT 0.5 0.3 1.1  PROT 7.3 5.5* 6.1  ALBUMIN 2.9* 2.1* 2.4*    Urine Studies     Component Value Date/Time   COLORURINE AMBER* 10/25/2013 1036   APPEARANCEUR CLOUDY* 10/25/2013 1036   LABSPEC 1.014 10/25/2013 1036   PHURINE 7.0 10/25/2013 1036   GLUCOSEU 500* 10/25/2013 1036   HGBUR MODERATE* 10/25/2013 1036   BILIRUBINUR NEGATIVE 10/25/2013 1036   KETONESUR NEGATIVE 10/25/2013 1036   PROTEINUR 30* 10/25/2013 1036   UROBILINOGEN 1.0 10/25/2013 1036   NITRITE NEGATIVE 10/25/2013 1036   LEUKOCYTESUR NEGATIVE 10/25/2013 1036   Cultures to date are negative.  Coagulation profile  Recent Labs Lab 10/23/13 1110  INR 1.21   B-12 levels 696 Folate 15.3 LDH elevated at 670 reticulocyte count is normal at 25 SPEP pending ANA negative    Recent Labs Lab 10/26/13 2032 10/26/13 2336 10/27/13 0407 10/27/13 0645 10/27/13 0808  GLUCAP 146* 162* 107* 99 96      Imaging Studies:  Dg Chest 2 View  10/25/2013   CLINICAL DATA:  Fever, weakness, unable to hold arms up, personal history of diabetes mellitus, Alzheimer's  EXAM: CHEST  2 VIEW  COMPARISON:  10/21/2013  FINDINGS: Normal heart size, mediastinal contours, and pulmonary vascularity.  Bronchitic changes with minimal bibasilar atelectasis.  Lungs otherwise clear.  No pleural effusion or pneumothorax.  Bones demineralized.  IMPRESSION: Bronchitic changes with bibasilar atelectasis.   Electronically Signed   By: Lavonia Dana M.D.   On: 10/25/2013 13:57   Ct Head Wo Contrast  10/25/2013   CLINICAL DATA:  Followup acute encephalopathy, recent intracranial hemorrhage, decline in mental status today, history Alzheimer's disease, anterior communicating artery aneurysm, diabetes mellitus  EXAM: CT HEAD WITHOUT CONTRAST  TECHNIQUE:  Contiguous axial images were obtained from the base of the skull through  the vertex without intravenous contrast.  COMPARISON:  10/22/2013 for an  FINDINGS: Generalized atrophy.  Normal ventricular morphology.  No midline shift or mass effect.  Small vessel chronic ischemic changes of deep cerebral white matter.  Subarachnoid hemorrhage again identified within the RIGHT sylvian fissure and within a few sulci at the posterior parietal lobes bilaterally, LEFT temporal lobe, and RIGHT frontal lobe.  No definite intraparenchymal hemorrhage, mass lesion, or areas of infarction.  Tiny amount of dependent intraventricular blood at the occipital horn of the RIGHT lateral ventricle.  Bones and sinuses unremarkable.  IMPRESSION: Little change in appearance of subarachnoid hemorrhage at the RIGHT sylvian fissure with additional areas of mildscattered subarachnoid blood at additional sites bilaterally.  Minimal dependent intraventricular blood at the occipital horn of the RIGHT lateral ventricle.  No new intraparenchymal abnormalities.   Electronically Signed   By: Lavonia Dana M.D.   On: 10/25/2013 13:56    Assessment/Plan: 73 y.o. 73 y.o. [place] woman with   Pancytopenia (chronic moderate thrombocytopenia but new severe leukopenia and anemia) The differential is broad and includes temporary marrow suppression from infection or other ingestion, toxicity from medications, nutritional deficiencies, primary marrow problems. ANA is negative  Patient has received PRBCs and platelets which may make some of these results hard to interpret. Patient's blood counts continue to decline, not returning to baseline levels without bleeding issues. However, her clinical condition has declined which makes plans for bone marrow biopsy difficult at this time. She is not responding to verbal or tactile stimuli and has not been able to have oral intake for the last 48 hrs.   Malnutrition Patient's husband is requesting tube feeding  for this patient. Will defer to primary team to address this issue. She may need Palliative Care evaluation  Code Status: DNR  I have a long discussion with husband and family. Husband insisted he does not want to make decision before EEG results are back. I do not recommend bone marrow biopsy nor transfusion support. I strongly recommend hospice care and comfort care only with no further blood draw, The family will think about it. They will call me if questions arise. I will sign off. Please call if questions arise.  Other medical issues as per admitting team     [facility], PA-C 10/27/2013  9:03 AM Amiera Herzberg, MD 10/27/2013

## 2013-10-27 NOTE — Consult Note (Signed)
Patient Jaclyn Hernandez      DOB: 1940-05-31      EBR:[id number]     Consult Note from the Palliative Medicine Team at [facility] Requested by: Dr Tana Coast    PCP: Marlou Sa, ERIC, MD Reason for Consultation:Clarification of [place] and options     Phone [phone]  Assessment of patients Current state:  Acute encephalopathy in the setting of advanced dementia and recent subarachnoid hemorrhage Pancytopenia with thrombocytopenia, underlying myelodysplastic syndrome.   No significant improvement, family faced with advanced directive decisions and anticipatory care needs.   Consult is for review of medical treatment options, clarification of goals of care and end of life issues, disposition and options, and symptom recommendation.  This NP Wadie Lessen reviewed medical records, received report from team, assessed the patient and then meet at the patient's bedside along with her husband   to discuss diagnosis prognosis, GOC, EOL wishes disposition and options.  A detailed discussion was had today regarding advanced directives.  Concepts specific to code status, artifical feeding and hydration, continued IV antibiotics and rehospitalization was had.  The difference between a aggressive medical intervention path  and a palliative comfort care path for this patient at this time was had.  Values and goals of care important to patient and family were attempted to be elicited.  Concept of Hospice and Palliative Care were discussed  Natural trajectory and expectations at EOL were discussed.  Questions and concerns addressed.  Hard Choices booklet left for review. Family encouraged to call with questions or concerns.  PMT will continue to support holistically.   Goals of Care: 1.  Code Status:DNR/DNI   2. Scope of Treatment:  Husband is open to all available and offered medical interventions to prolong life.  Hopeful for return to baseline   1. Vital Signs:per  unit  2. Respiratory/Oxygen:as needed 3. Nutritional Support/Tube Feeds: would consider PEG 4. Antibiotics:yes 5. Blood Products:yes 6. IVF:yes 7. Labs:yes 8. Telemetry:yes   3. Disposition:  Husband is thinking that a SNF for rehabilitation is the best opetion.  I discussed in detail the current situation and the fact that his wife is unable to participate in rehab at this time and indeed she is appropriate for in patient hospice facility.   4. Symptom Management:   1.  Weakness/Acute encephalopathy: medical management of acute/ chronic disease.  Husband is hopeful for improvement   5. Psychosocial:  Emotional support offered to family at bedside   6. Spiritual:  Strong community church support     Patient Documents Completed or Given: Document Given Completed  Advanced Directives Pkt    MOST X   DNR    Gone from My Sight    Hard Choices X     Brief HPI: Jaclyn Hernandez is an 73 y.o. female, right handed, with a past medical history significant for DM, PR, advanced AD, brought to [facility] via EMS from home with altered mental status.   Work up in the ED includes CT brain that revealed " multi compartmental acute intracranial hemorrhage, with a largest focus in the right sylvian fissure extending superiorly along the sulci and into the right frontal sulci.  Additional foci of subarachnoid hemorrhage include the left temporal sulci, left sylvian fissure, and along the right petroclival ligament. Focus of hemorrhage within the right cerebellar hemisphere may be subarachnoid or intraparenchymal, and there is also blood products layered within the posterior horn the lateral ventricles".  I Also with underlying myelodysplastic sydrome  Patient has received PRBCs and platelets  Remains minimally responsive     ROS:  Unable to illict due to altered mental status   PMH:  Past Medical History  Diagnosis Date  . Diabetes mellitus   . Alzheimer's disease   . Urinary tract  infection, site not specified   . Hematuria, unspecified   . Chronic fatigue syndrome   . Alzheimer's dementia   . Colonic dysfunction   . Fibromyalgia   . Anemia   . Polymyalgia rheumatica   . Depression   . Anxiety   . Pancreatic insufficiency   . Unspecified transient cerebral ischemia      PSH: Past Surgical History  Procedure Laterality Date  . None     I have reviewed the FH and SH and  If appropriate update it with new information. Allergies  Allergen Reactions  . Sulfa Antibiotics Other (See Comments)    unknown   Scheduled Meds: . antiseptic oral rinse  7 mL Mouth Rinse q12n4p  . ceFEPime (MAXIPIME) IV  2 g Intravenous Q12H  . chlorhexidine  15 mL Mouth Rinse BID  . citalopram  20 mg Oral Daily  . feeding supplement (GLUCERNA SHAKE)  237 mL Oral TID BM  . Influenza vac split quadrivalent PF  0.5 mL Intramuscular Tomorrow-1000  . insulin aspart  0-9 Units Subcutaneous 6 times per day  . insulin glargine  10 Units Subcutaneous QHS  . lipase/protease/amylase  24,000 Units Oral BID AC  . Memantine HCl ER  28 mg Oral Daily  . predniSONE  15 mg Oral Q breakfast  . rivastigmine  3 mg Oral BID  . vancomycin  750 mg Intravenous Q24H   Continuous Infusions: . sodium chloride 100 mL/hr at 10/27/13 0933   PRN Meds:.acetaminophen, acetaminophen, albuterol, dextrose    BP 124/56  Pulse 100  Temp(Src) 99 F (37.2 C) (Axillary)  Resp 18  Ht 5' (1.524 m)  Wt 51.6 kg (113 lb 12.1 oz)  BMI 22.22 kg/m2  SpO2 100%   PPS:30 % at best   Intake/Output Summary (Last 24 hours) at 10/27/13 1449 Last data filed at 10/27/13 1130  Gross per 24 hour  Intake    563 ml  Output      0 ml  Net    563 ml    Physical Exam:  General: ill appearing, minimally responsive HEENT: mosit buccal membraens Chest:   Decreased in bases CVS: tachycardic, rate 108 Abdomen: soft NT +BS Ext: wtihout edema Neuro:    Labs: CBC    Component Value Date/Time   WBC 0.7* 10/27/2013  0548   WBC 5.6 12/18/2011   RBC 3.31* 10/27/2013 0548   RBC 3.12* 10/24/2013 0314   HGB 10.6* 10/27/2013 0548   HCT 32.2* 10/27/2013 0548   PLT 25* 10/27/2013 0548   MCV 97.3 10/27/2013 0548   MCH 32.0 10/27/2013 0548   MCHC 32.9 10/27/2013 0548   RDW 21.9* 10/27/2013 0548   LYMPHSABS 0.3* 10/25/2013 1142   MONOABS 0.0* 10/25/2013 1142   EOSABS 0.0 10/25/2013 1142   BASOSABS 0.0 10/25/2013 1142    BMET    Component Value Date/Time   NA 138 10/27/2013 0548   NA 139 02/08/2012   K 3.3* 10/27/2013 0548   CL 103 10/27/2013 0548   CO2 23 10/27/2013 0548   GLUCOSE 101* 10/27/2013 0548   BUN 10 10/27/2013 0548   BUN 22* 02/08/2012   CREATININE 0.42* 10/27/2013 0548   CREATININE 1.0 02/08/2012   CALCIUM 7.4*  10/27/2013 0548   GFRNONAA >90 10/27/2013 0548   GFRAA >90 10/27/2013 0548    CMP     Component Value Date/Time   NA 138 10/27/2013 0548   NA 139 02/08/2012   K 3.3* 10/27/2013 0548   CL 103 10/27/2013 0548   CO2 23 10/27/2013 0548   GLUCOSE 101* 10/27/2013 0548   BUN 10 10/27/2013 0548   BUN 22* 02/08/2012   CREATININE 0.42* 10/27/2013 0548   CREATININE 1.0 02/08/2012   CALCIUM 7.4* 10/27/2013 0548   PROT 6.1 10/24/2013 0314   ALBUMIN 2.4* 10/24/2013 0314   AST 107* 10/24/2013 0314   ALT 86* 10/24/2013 0314   ALKPHOS 69 10/24/2013 0314   BILITOT 1.1 10/24/2013 0314   GFRNONAA >90 10/27/2013 0548   GFRAA >90 10/27/2013 0548     Time In Time Out Total Time Spent with Patient Total Overall Time  1200 1315 70 min     Greater than 50%  of this time was spent counseling and coordinating care related to the above assessment and plan.   Wadie Lessen NP  Palliative Medicine Team Team Phone # [phone] Pager [phone]  Discussed with Dr Tana Coast

## 2013-10-27 NOTE — Progress Notes (Signed)
NEURO HOSPITALIST PROGRESS NOTE   SUBJECTIVE:                                                                                                                        Asleep with eyes closed. No verbal output. Not responding to voice. Moans to noxious stimuli.  Repeat CT 10/24 shows no significant change. Except for pancytopenia, serologies and UA are not impressive. Afebrile.   OBJECTIVE:                                                                                                                           Vital signs in last 24 hours: Temp:  [98.1 F (36.7 C)-102.8 F (39.3 C)] 99.9 F (37.7 C) (10/26 1145) Pulse Rate:  [70-114] 103 (10/26 1145) Resp:  [18-20] 20 (10/26 1145) BP: (107-123)/(54-66) 108/59 mmHg (10/26 1145) SpO2:  [93 %-100 %] 100 % (10/26 1145)  Intake/Output from previous day:   Intake/Output this shift: Total I/O In: 563 [Blood:563] Out: -  Nutritional status: Dysphagia  Past Medical History  Diagnosis Date  . Diabetes mellitus   . Alzheimer's disease   . Urinary tract infection, site not specified   . Hematuria, unspecified   . Chronic fatigue syndrome   . Alzheimer's dementia   . Colonic dysfunction   . Fibromyalgia   . Anemia   . Polymyalgia rheumatica   . Depression   . Anxiety   . Pancreatic insufficiency   . Unspecified transient cerebral ischemia    Physical exam: no apparent distress.  Head: normocephalic.  Neck: mildly stiff, no bruits, no JVD.  Cardiac: no murmurs.  Lungs: clear.  Abdomen: soft, no tender, no mass.  Extremities: no edema LE.    Neurologic Exam:  Mental status: she is not opening her eyes or following commands. No verbal output. Moans to noxious stimuli CN 2-12: pupils seem to be symmetric, EOM appear to be preserved, no nystagmus. Face symmetric. Tongue midline.  Motor: minimal motor spontaneous movements.  Sensory: reacts to pain with moan and mild withdrawal symmetric in  extremities DTR's: 1+ all over.  Plantars: left upgoing, right downgoing.  Coordination and gait: unable to test No meningeal irritation signs.   Lab Results: Lab Results  Component Value  Date/Time   CHOL 236* 02/08/2012   Lipid Panel No results found for this basename: CHOL, TRIG, HDL, CHOLHDL, VLDL, LDLCALC,  in the last 72 hours  Studies/Results: Dg Chest 2 View  10/25/2013   CLINICAL DATA:  Fever, weakness, unable to hold arms up, personal history of diabetes mellitus, Alzheimer's  EXAM: CHEST  2 VIEW  COMPARISON:  10/21/2013  FINDINGS: Normal heart size, mediastinal contours, and pulmonary vascularity.  Bronchitic changes with minimal bibasilar atelectasis.  Lungs otherwise clear.  No pleural effusion or pneumothorax.  Bones demineralized.  IMPRESSION: Bronchitic changes with bibasilar atelectasis.   Electronically Signed   By: Lavonia Dana M.D.   On: 10/25/2013 13:57   Ct Head Wo Contrast  10/25/2013   CLINICAL DATA:  Followup acute encephalopathy, recent intracranial hemorrhage, decline in mental status today, history Alzheimer's disease, anterior communicating artery aneurysm, diabetes mellitus  EXAM: CT HEAD WITHOUT CONTRAST  TECHNIQUE: Contiguous axial images were obtained from the base of the skull through the vertex without intravenous contrast.  COMPARISON:  10/22/2013 for an  FINDINGS: Generalized atrophy.  Normal ventricular morphology.  No midline shift or mass effect.  Small vessel chronic ischemic changes of deep cerebral white matter.  Subarachnoid hemorrhage again identified within the RIGHT sylvian fissure and within a few sulci at the posterior parietal lobes bilaterally, LEFT temporal lobe, and RIGHT frontal lobe.  No definite intraparenchymal hemorrhage, mass lesion, or areas of infarction.  Tiny amount of dependent intraventricular blood at the occipital horn of the RIGHT lateral ventricle.  Bones and sinuses unremarkable.  IMPRESSION: Little change in appearance of  subarachnoid hemorrhage at the RIGHT sylvian fissure with additional areas of mildscattered subarachnoid blood at additional sites bilaterally.  Minimal dependent intraventricular blood at the occipital horn of the RIGHT lateral ventricle.  No new intraparenchymal abnormalities.   Electronically Signed   By: Lavonia Dana M.D.   On: 10/25/2013 13:56    MEDICATIONS                                                                                                                        Scheduled: . antiseptic oral rinse  7 mL Mouth Rinse q12n4p  . ceFEPime (MAXIPIME) IV  2 g Intravenous Q12H  . chlorhexidine  15 mL Mouth Rinse BID  . citalopram  20 mg Oral Daily  . feeding supplement (GLUCERNA SHAKE)  237 mL Oral TID BM  . Influenza vac split quadrivalent PF  0.5 mL Intramuscular Tomorrow-1000  . insulin aspart  0-9 Units Subcutaneous 6 times per day  . insulin glargine  10 Units Subcutaneous QHS  . lipase/protease/amylase  24,000 Units Oral BID AC  . Memantine HCl ER  28 mg Oral Daily  . potassium chloride  10 mEq Intravenous Q1 Hr x 3  . predniSONE  15 mg Oral Q breakfast  . rivastigmine  3 mg Oral BID  . 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   Mental status has declined over past few days. . Metabolic derangements corrected and no evidence of ongoing infection. Repeat CT brain was essentially unchanged. EEG completed this morning to exclude the possibility of subclinical seizures. Awaiting read Will follow up.  Jim Like, DO [facility] [phone]  If 7pm- 7am, please page neurology  on call as listed in [place].   10/27/2013, 12:07 PM

## 2013-10-27 NOTE — Progress Notes (Signed)
EEG completed; results pending.    

## 2013-10-27 NOTE — Progress Notes (Signed)
UR COMPLETED  

## 2013-10-27 NOTE — Procedures (Signed)
ELECTROENCEPHALOGRAM REPORT  Date of Study: 10/27/2013  Patient's Name: Jaclyn Hernandez MRN: [id number] Date of Birth: [birth year]  Referring Provider: Dr. Kelvin Cellar  Clinical History: This is a 73 year old woman with altered mental status and multi-compartmental acute subarachnoid hemorrhages.  Patient subsequently became alert and returned to her baseline mental status. She was transferred out to the floor. On 10/25/2013 patient had again steep functional decline, difficult to arouse or follow commands.  Medications: acetaminophen  ceFEPIme (MAXIPIME) 2 g in dextrose 5 % 50 mL IVPB  citalopram (CELEXA) tablet 20 mg  insulin aspart (novoLOG) injection 0-9 Units  insulin glargine (LANTUS) injection 10 Units  Memantine HCl ER CP24 28 mg  predniSONE (DELTASONE) tablet 15 mg  rivastigmine (EXELON) capsule 3 mg  vancomycin (VANCOCIN) IVPB 750 mg/150 ml premix   Technical Summary: A multichannel digital EEG recording measured by the international 10-20 system with electrodes applied with paste and impedances below 5000 ohms performed as portable with EKG monitoring in an asleep patient.  Hyperventilation and photic stimulation were not performed.  The digital EEG was referentially recorded, reformatted, and digitally filtered in a variety of bipolar and referential montages for optimal display.   Description: The patient is asleep and difficult to arouse during the recording. There is no clear posterior dominant rhythm. The background consists of a large amount of diffuse 5-6 Hz theta slowing and moderate diffuse 2-3 Hz delta slowing. Sleep architecture with poorly formed vertex waves and sleep spindles are seen. There are occasional triphasic waves seen throughout the recording. With noxious stimulation, there is slight increase in faster frequencies. There were no clear epileptiform discharges or electrographic seizures seen.    EKG lead showed sinus tachycardia.  Impression: This  asleep EEG is abnormal due to the presence of moderate diffuse slowing of the background with occasional triphasic waves.  Clinical Correlation of the above findings indicates diffuse cerebral dysfunction that is non-specific in etiology and can be seen with hypoxic/ischemic injury, toxic/metabolic encephalopathies, neurodegenerative disorders, medication effect. Triphasic waves are typically seen with hepatic encephalopathy, but may be seen with other metabolic encephalopathies as well. There were no electrographic seizures seen in this study.    Ellouise Newer, M.D.

## 2013-10-27 NOTE — Progress Notes (Addendum)
Patient ID: Jaclyn Hernandez  female  BSW:[id number]    DOB: January 31, 1940    DOA: 10/21/2013  PCP: Kevan Ny, MD  Brief narrative:  73 year old BF PMHx diabetes and advanced Alzheimer disease as well as polymyalgia rheumatica. Lives at home with husband, She was found to have progressive altered mentation, lethargy and inability to eat. At baseline she ambulates with a walker. For the past 2 weeks the family has noted the patient becoming less ambulatory. Over the past 24 hours prior to admission, she became less responsive and not eating.  Upon arrival to the ER patient underwent a CT of the head which showed an multi-compartmental acute subarachnoid hemorrhages. Neurology was consulted. She was also found to have an elevated blood sugar of 400. Patient was noted to be pancytopenic. She has had chronic thrombocytopenia since 2012 but the leukopenia was new. Her white blood cell count was 700 with a low absolute neutrophil count. In the ER the patient was also noted to be febrile with a temperature of 101.3 and tachycardic. Her sodium was elevated at 149 with elevated ALT and AST, mildly elevated creatinine are concerning for volume depletion.   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 No right MCA aneurysm identified.  On 10/21 Patient developed hypernatremia Na 158, PLT dropped to 39,000, given the acute intracranial bleeding, patient was transfused platelets pheresis. Patient subsequently became alert and returned to her baseline mental status. She was transferred out to the floor.   On 10/25/2013 patient had again steep functional decline, difficult to arouse or follow commands, not taking PO, vitals signs remained stable. On exam she was moving all extremities. UA and chest x-ray did not reveal  infection. CT scan of her brain showed no significant change in appearance of SAH. Neurology was reconsulted and recommended EEG.     Assessment/Plan: Principal Problem:   Acute encephalopathy in the setting of advanced dementia and recent subarachnoid hemorrhage - Patient has history of advanced dementia, brought in due to acute encephalopathy, metabolic with subarachnoid hemorrhage - Chest x-ray UA negative repeat CT of the brain showed no significant change in appearance of [facility] - Neurology consulted, recommended EEG to exclude subclinical seizures, results pending - Blood cultures negative so far  - No significant improvement, discussed in detail with the patient's husband at the bedside, consult palliative medicine for Goals of care  Active Problems:   SAH (subarachnoid hemorrhage) - Appreciate neurology assistance-has tiny left PCA aneurysm which according to neurology is not the etiology for the patient's subarachnoid hemorrhage - Family had reported recent fall and SAH could be posttraumatic-no MCA aneurysm on CT angio-repeat CT brain 10/21 with decreased volume of SAH - repeat CT scan 10/25/2013 showed little change in appearance of subarachnoid hemorrhage at the right sylvian fissure, no new intraparenchymal abnormalities.   Pancytopenia with thrombocytopenia - History of chronic thrombocytopenia since 2012 , in March of this year platelets 44,000 - Patient received 1 unit of plateletpheresis during hospitalization - patient may have evolving myelodysplastic syndrome, hematology was consulted. Per hematology, may require bone marrow biopsy, workup can completed in the outpatient setting/Dr. Magrinat to arrange for outpatient followup - will transfuse platelets today  Hypernatremia /Dehydration with mild acute renal failure  Resolved  Nutrition - Please Panda tube, patient is not eating due to acute encephalopathy. Discussed with  patient's husband at the bedside who wants  artificial feeding.  Diabetes mellitus  Was on Amaryl and Januvia pre-admission-continue sliding scale insulin-hemoglobin A1c 7.3  Polymyalgia rheumatica on chronic prednisone  Continue home dose prednisone   Advanced Alzheimer's dementia  Continue Celexa, Namenda and Exelon   DVT Prophylaxis:  Code Status:  Family Communication:  Disposition:  Consultants:  Neurology  Hematology  Procedures:  None  Antibiotics:  None  Subjective: Patient seen and examined, lethargic, not following any verbal commands, husband at the bedside  Objective: Weight change:   Intake/Output Summary (Last 24 hours) at 10/27/13 1258 Last data filed at 10/27/13 1130  Gross per 24 hour  Intake    563 ml  Output      0 ml  Net    563 ml   Blood pressure 113/60, pulse 102, temperature 99.5 F (37.5 C), temperature source Axillary, resp. rate 20, height 5' (1.524 m), weight 51.6 kg (113 lb 12.1 oz), SpO2 100.00%.  Physical Exam: General: Lethargic, and not following any verbal commands CVS: S1-S2 clear, no murmur rubs or gallops Chest: clear to auscultation bilaterally Abdomen: soft, ND, NBS Extremities: no c/c/e bilaterally Neuro: Not following any commands  Lab Results: Basic Metabolic Panel:  Recent Labs Lab 10/24/13 0314  10/26/13 0515 10/27/13 0548  NA 142  < > 134* 138  K 2.8*  < > 4.3 3.3*  CL 103  < > 99 103  CO2 27  < > 23 23  GLUCOSE 235*  < > 193* 101*  BUN 15  < > 13 10  CREATININE 0.47*  < > 0.45* 0.42*  CALCIUM 7.8*  < > 7.9* 7.4*  MG 2.2  --   --   --   < > = values in this interval not displayed. Liver Function Tests:  Recent Labs Lab 10/23/13 0300 10/24/13 0314  AST 107* 107*  ALT 90* 86*  ALKPHOS 59 69  BILITOT 0.3 1.1  PROT 5.5* 6.1  ALBUMIN 2.1* 2.4*   No results found for this basename: LIPASE, AMYLASE,  in the last 168 hours No results found for this basename: AMMONIA,  in the last 168 hours CBC:  Recent Labs Lab 10/25/13 1142  10/26/13 0515 10/27/13 0548  WBC 0.9* 0.8* 0.7*  NEUTROABS 0.6*  --   --   HGB 11.5* 11.3* 10.6*  HCT 32.9* 33.2* 32.2*  MCV 92.4 94.6 97.3  PLT 96* 50* 25*   Cardiac Enzymes: No results found for this basename: CKTOTAL, CKMB, CKMBINDEX, TROPONINI,  in the last 168 hours BNP: No components found with this basename: POCBNP,  CBG:  Recent Labs Lab 10/26/13 2336 10/27/13 0407 10/27/13 0645 10/27/13 0808 10/27/13 1135  GLUCAP 162* 107* 99 96 102*     Micro Results: Recent Results (from the past 240 hour(s))  URINE CULTURE     Status: None   Collection Time    10/21/13 11:35 AM      Result Value Ref Range Status   Specimen Description URINE, RANDOM   Final   Special Requests ADDED [id number]   Final   Culture  Setup Time     Final   Value: 10/21/2013 17:42     Performed at [place]     Final   Value: NO GROWTH     Performed at [facility]   Culture     Final   Value: NO GROWTH     Performed at [facility]  Report Status 10/22/2013 FINAL   Final  CULTURE, BLOOD (ROUTINE X 2)     Status: None   Collection Time    10/21/13 12:00 PM      Result Value Ref Range Status   Specimen Description BLOOD RIGHT ANTECUBITAL   Final   Special Requests BOTTLES DRAWN AEROBIC AND ANAEROBIC 5CC   Final   Culture  Setup Time     Final   Value: 10/21/2013 17:33     Performed at [facility]   Culture     Final   Value: NO GROWTH 5 DAYS     Performed at [facility]   Report Status 10/27/2013 FINAL   Final  CULTURE, BLOOD (ROUTINE X 2)     Status: None   Collection Time    10/21/13 12:08 PM      Result Value Ref Range Status   Specimen Description BLOOD HAND RIGHT   Final   Special Requests BOTTLES DRAWN AEROBIC ONLY 5CC   Final   Culture  Setup Time     Final   Value: 10/21/2013 17:33     Performed at [facility]   Culture     Final   Value: NO GROWTH 5 DAYS     Performed at [facility]    Report Status 10/27/2013 FINAL   Final  MRSA PCR SCREENING     Status: None   Collection Time    10/22/13 10:12 AM      Result Value Ref Range Status   MRSA by PCR NEGATIVE  NEGATIVE Final   Comment:            The GeneXpert MRSA Assay (FDA     approved for NASAL specimens     only), is one component of a     comprehensive MRSA colonization     surveillance program. It is not     intended to diagnose MRSA     infection nor to guide or     monitor treatment for     MRSA infections.  CULTURE, BLOOD (ROUTINE X 2)     Status: None   Collection Time    10/25/13 11:35 AM      Result Value Ref Range Status   Specimen Description BLOOD RIGHT HAND   Final   Special Requests BOTTLES DRAWN AEROBIC AND ANAEROBIC 10CC EACH   Final   Culture  Setup Time     Final   Value: 10/25/2013 18:04     Performed at [facility]   Culture     Final   Value:        BLOOD CULTURE RECEIVED NO GROWTH TO DATE CULTURE WILL BE HELD FOR 5 DAYS BEFORE ISSUING A FINAL NEGATIVE REPORT     Performed at [facility]   Report Status PENDING   Incomplete  CULTURE, BLOOD (ROUTINE X 2)     Status: None   Collection Time    10/25/13  1:40 PM      Result Value Ref Range Status   Specimen Description BLOOD RIGHT HAND   Final   Special Requests BOTTLES DRAWN AEROBIC ONLY 3CC   Final   Culture  Setup Time     Final   Value: 10/25/2013 18:13     Performed at [facility]   Culture     Final   Value:        BLOOD CULTURE RECEIVED NO GROWTH TO DATE CULTURE WILL BE HELD  FOR 5 DAYS BEFORE ISSUING A FINAL NEGATIVE REPORT     Performed at [facility]   Report Status PENDING   Incomplete    Studies/Results: Ct Angio Head W/cm &/or Wo Cm  10/21/2013   CLINICAL DATA:  Intracerebral hemorrhage.  EXAM: CT ANGIOGRAPHY HEAD  TECHNIQUE: Multidetector CT imaging of the head was performed using the standard protocol during bolus administration of intravenous contrast. Multiplanar CT image  reconstructions and MIPs were obtained to evaluate the vascular anatomy.  CONTRAST:  79mL OMNIPAQUE IOHEXOL 350 MG/ML SOLN  COMPARISON:  Head CT 10/21/2013 and MRA 08/28/2007  FINDINGS: Acute subarachnoid hemorrhage does not appear significantly changed from recent noncontrast head CT, with the greatest volume of hemorrhage present in the right sylvian fissure extending into right frontoparietal sulci. A small amount of hemorrhage is also again seen in left temporal sulci, as well as at the posterior aspect of the right cerebellum. Small amount of intraventricular hemorrhage is again noted in the occipital horns of the lateral ventricles. No abnormal enhancement is identified. There is moderate cerebral atrophy with moderate chronic small vessel ischemic disease. There is no evidence of acute large territory infarct, mass, or midline shift.  Prior bilateral cataract extraction is noted. Mastoid air cells and visualized paranasal sinuses are clear.  Visualized distal vertebral arteries are patent and codominant. Left PICA origin is patent. Right PICA origin is not clearly identified although it may arise from the right AICA, which is patent and dominant. Basilar artery is patent without stenosis. SCA origins are patent. There are medium sized posterior communicating arteries bilaterally with fetal type origins of the PCAs. Left P1 segment is hypoplastic. Right P1 segment is either severely hypoplastic or absent. PCAs are otherwise unremarkable.  Internal carotid arteries are patent from skullbase to carotid termini without stenosis. Minimal intracranial ICA calcification is noted. There is a 2 x 2 mm posteroinferiorly directed outpouching from the left supraclinoid ICA at the posterior communicating artery origin. ACAs and MCAs are unremarkable. Specifically, no right MCA aneurysm is identified in the region of greatest hemorrhage in the right sylvian fissure.  Review of the MIP images confirms the above findings.   IMPRESSION: 1. 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 This may be incidental given its location relative to the acute subarachnoid hemorrhage. No right MCA aneurysm identified. 2. No major intracranial arterial occlusion or significant stenosis. 3. No significant interval change in acute subarachnoid hemorrhage and small volume intraventricular hemorrhage.   Electronically Signed   By: Logan Bores   On: 10/21/2013 16:46   Dg Chest 2 View  10/25/2013   CLINICAL DATA:  Fever, weakness, unable to hold arms up, personal history of diabetes mellitus, Alzheimer's  EXAM: CHEST  2 VIEW  COMPARISON:  10/21/2013  FINDINGS: Normal heart size, mediastinal contours, and pulmonary vascularity.  Bronchitic changes with minimal bibasilar atelectasis.  Lungs otherwise clear.  No pleural effusion or pneumothorax.  Bones demineralized.  IMPRESSION: Bronchitic changes with bibasilar atelectasis.   Electronically Signed   By: Lavonia Dana M.D.   On: 10/25/2013 13:57   Dg Chest 2 View  10/21/2013   CLINICAL DATA:  Altered level of consciousness, mental status change ; history of TIAs and diabetes  EXAM: CHEST  2 VIEW  COMPARISON:  PA and lateral chest x-ray of December 18, 2008  FINDINGS: The lungs are borderline hypoinflated. There is no focal infiltrate. There is stable subcentimeter parenchymal density in the right mid lung consistent with scarring. There is minimal  bibasilar linear increased density consistent with atelectasis. The cardiac silhouette is normal in size. The pulmonary vascularity is not engorged. The mediastinum is normal in width. There is no pleural effusion. The bony thorax is unremarkable.  IMPRESSION: There is no evidence of pneumonia nor CHF nor other acute cardiopulmonary abnormality. There is minimal bibasilar atelectasis or scarring.   Electronically Signed   By: David  Martinique   On: 10/21/2013 13:25   Ct Head Wo Contrast  10/25/2013   CLINICAL DATA:  Followup acute encephalopathy,  recent intracranial hemorrhage, decline in mental status today, history Alzheimer's disease, anterior communicating artery aneurysm, diabetes mellitus  EXAM: CT HEAD WITHOUT CONTRAST  TECHNIQUE: Contiguous axial images were obtained from the base of the skull through the vertex without intravenous contrast.  COMPARISON:  10/22/2013 for an  FINDINGS: Generalized atrophy.  Normal ventricular morphology.  No midline shift or mass effect.  Small vessel chronic ischemic changes of deep cerebral white matter.  Subarachnoid hemorrhage again identified within the RIGHT sylvian fissure and within a few sulci at the posterior parietal lobes bilaterally, LEFT temporal lobe, and RIGHT frontal lobe.  No definite intraparenchymal hemorrhage, mass lesion, or areas of infarction.  Tiny amount of dependent intraventricular blood at the occipital horn of the RIGHT lateral ventricle.  Bones and sinuses unremarkable.  IMPRESSION: Little change in appearance of subarachnoid hemorrhage at the RIGHT sylvian fissure with additional areas of mildscattered subarachnoid blood at additional sites bilaterally.  Minimal dependent intraventricular blood at the occipital horn of the RIGHT lateral ventricle.  No new intraparenchymal abnormalities.   Electronically Signed   By: Lavonia Dana M.D.   On: 10/25/2013 13:56   Ct Head Wo Contrast  10/22/2013   CLINICAL DATA:  73 year old female with altered mental status, found to have acute intracranial hemorrhage on 10/2013, and left posterior communicating artery aneurysm by CTA that day. Initial encounter.  EXAM: CT HEAD WITHOUT CONTRAST  TECHNIQUE: Contiguous axial images were obtained from the base of the skull through the vertex without intravenous contrast.  COMPARISON:  10/21/2013.  FINDINGS: Visualized paranasal sinuses and mastoids are clear. No acute osseous abnormality identified. No acute orbit or scalp soft tissue findings.  Calcified atherosclerosis at the skull base. Mildly decreased  volume of subarachnoid hemorrhage, right sylvian fissure residual. No intraventricular hemorrhage identified today knee. Stable ventricle size and configuration. No midline shift, mass effect, or evidence of intracranial mass lesion. Stable gray-white matter differentiation throughout the brain. No evidence of cortically based acute infarction identified.  IMPRESSION: Slightly decreased volume of subarachnoid hemorrhage. No ventriculomegaly or new intracranial abnormality identified.   Electronically Signed   By: Lars Pinks M.D.   On: 10/22/2013 23:41   Ct Head Wo Contrast  10/21/2013   CLINICAL DATA:  73 year old female with hyperglycemia and altered mental status. History of Alzheimer's.  EXAM: CT HEAD WITHOUT CONTRAST  TECHNIQUE: Contiguous axial images were obtained from the base of the skull through the vertex without intravenous contrast.  COMPARISON:  CT 01/09/2009, 12/23/2008  FINDINGS: Unremarkable appearance of the calvarium without acute fracture or aggressive lesion. No scalp swelling.  No significant paranasal sinus disease. Mastoid air cells are clear.  Unremarkable appearance of the orbits with bilateral lens extraction.  Multi compartmental acute hemorrhage, with the predominant focus within the right sylvian fissure and right frontal sulci. There also appears to be a small amount of hemorrhage in the right petroclival region and posterior to the right sella turcica. Small focus of subarachnoid hemorrhage in the sulci  of the left temporal lobe. There also appears to be a small amount of intraparenchymal hemorrhage versus subarachnoid hemorrhage of the right cerebellar hemisphere.  Hemorrhage layered within the bilateral posterior horns of the lateral ventricles.  Progression of brain volume loss with expansion of the ventricles. Periventricular hypodensity. Intracranial atherosclerosis.  No midline shift.  IMPRESSION: Multi compartmental acute intracranial hemorrhage, with a largest focus in the  right sylvian fissure extending superiorly along the sulci and into the right frontal sulci.  Additional foci of subarachnoid hemorrhage include the left temporal sulci, left sylvian fissure, and along the right petroclival ligament. Focus of hemorrhage within the right cerebellar hemisphere may be subarachnoid or intraparenchymal, and there is also blood products layered within the posterior horn the lateral ventricles.  Progression of senescent brain volume loss, periventricular white matter and associated intracranial atherosclerosis.  These results were called by telephone at the time of interpretation on 10/21/2013 at 1:34 pm to Dr. Domenic Moras , who verbally acknowledged these results.  Signed,  Dulcy Fanny. Earleen Newport, DO  [facility]  [facility]   Electronically Signed   By: Corrie Mckusick D.O.   On: 10/21/2013 13:37    Medications: Scheduled Meds: . antiseptic oral rinse  7 mL Mouth Rinse q12n4p  . ceFEPime (MAXIPIME) IV  2 g Intravenous Q12H  . chlorhexidine  15 mL Mouth Rinse BID  . citalopram  20 mg Oral Daily  . feeding supplement (GLUCERNA SHAKE)  237 mL Oral TID BM  . Influenza vac split quadrivalent PF  0.5 mL Intramuscular Tomorrow-1000  . insulin aspart  0-9 Units Subcutaneous 6 times per day  . insulin glargine  10 Units Subcutaneous QHS  . lipase/protease/amylase  24,000 Units Oral BID AC  . Memantine HCl ER  28 mg Oral Daily  . predniSONE  15 mg Oral Q breakfast  . rivastigmine  3 mg Oral BID  . vancomycin  750 mg Intravenous Q24H      LOS: 6 days   Jenise Iannelli M.D. [facility] 10/27/2013, 12:58 PM Pager: [phone]  If 7PM-7AM, please contact night-coverage [email or web address] Password [email or web address]

## 2013-10-27 NOTE — Progress Notes (Signed)
PT Cancellation Note  Patient Details Name: Jaclyn Hernandez MRN: [id number] DOB: 10/24/1940   Cancelled Treatment:    Reason Eval/Treat Not Completed: Medical issues which prohibited therapy. Patient unresponsive with nursing the last 48 hours (per nurse). Labs are not stable. Will follow up as appropriate.    [name] 10/27/2013, 11:18 AM

## 2013-10-28 DIAGNOSIS — Z515 Encounter for palliative care: Secondary | ICD-10-CM

## 2013-10-28 DIAGNOSIS — Z66 Do not resuscitate: Secondary | ICD-10-CM

## 2013-10-28 DIAGNOSIS — R531 Weakness: Secondary | ICD-10-CM

## 2013-10-28 DIAGNOSIS — R0600 Dyspnea, unspecified: Secondary | ICD-10-CM

## 2013-10-28 LAB — GLUCOSE, CAPILLARY
GLUCOSE-CAPILLARY: 132 mg/dL — AB (ref 70–99)
GLUCOSE-CAPILLARY: 169 mg/dL — AB (ref 70–99)
GLUCOSE-CAPILLARY: 221 mg/dL — AB (ref 70–99)
Glucose-Capillary: 110 mg/dL — ABNORMAL HIGH (ref 70–99)
Glucose-Capillary: 100 mg/dL — ABNORMAL HIGH (ref 70–99)

## 2013-10-28 LAB — BASIC METABOLIC PANEL
Anion gap: 16 — ABNORMAL HIGH (ref 5–15)
BUN: 9 mg/dL (ref 6–23)
CO2: 23 mEq/L (ref 19–32)
Calcium: 8 mg/dL — ABNORMAL LOW (ref 8.4–10.5)
Chloride: 99 mEq/L (ref 96–112)
Creatinine, Ser: 0.42 mg/dL — ABNORMAL LOW (ref 0.50–1.10)
GFR calc non Af Amer: >90 mL/min (ref >90)
GLUCOSE: 97 mg/dL (ref 70–99)
POTASSIUM: 3.3 meq/L — AB (ref 3.7–5.3)
Sodium: 138 mEq/L (ref 137–147)

## 2013-10-28 LAB — PROTEIN ELECTROPHORESIS, SERUM
ALBUMIN ELP: 47.1 % — AB (ref 55.8–66.1)
Alpha-1-Globulin: 8.3 % — ABNORMAL HIGH (ref 2.9–4.9)
Alpha-2-Globulin: 14.7 % — ABNORMAL HIGH (ref 7.1–11.8)
BETA 2: 6.9 % — AB (ref 3.2–6.5)
Beta Globulin: 5.9 % (ref 4.7–7.2)
Gamma Globulin: 17.1 % (ref 11.1–18.8)
M-Spike, %: NOT DETECTED g/dL
TOTAL PROTEIN ELP: 5.3 g/dL — AB (ref 6.0–8.3)

## 2013-10-28 LAB — BETA-2-GLYCOPROTEIN I ABS, IGG/M/A
Beta-2 Glyco I IgG: 5 G Units (ref <20)
Beta-2-Glycoprotein I IgA: 5 A Units (ref <20)
Beta-2-Glycoprotein I IgM: 4 M Units (ref <20)

## 2013-10-28 LAB — CBC
HCT: 28 % — ABNORMAL LOW (ref 36.0–46.0)
HEMATOCRIT: 27.9 % — AB (ref 36.0–46.0)
HEMOGLOBIN: 9.4 g/dL — AB (ref 12.0–15.0)
HEMOGLOBIN: 9.4 g/dL — AB (ref 12.0–15.0)
MCH: 32.1 pg (ref 26.0–34.0)
MCH: 32.5 pg (ref 26.0–34.0)
MCHC: 33.7 g/dL (ref 30.0–36.0)
MCHC: 33.6 g/dL (ref 30.0–36.0)
MCV: 95.2 fL (ref 78.0–100.0)
MCV: 96.9 fL (ref 78.0–100.0)
Platelets: 94 10*3/uL — ABNORMAL LOW (ref 150–400)
Platelets: 80 10*3/uL — ABNORMAL LOW (ref 150–400)
RBC: 2.93 MIL/uL — ABNORMAL LOW (ref 3.87–5.11)
RBC: 2.89 MIL/uL — AB (ref 3.87–5.11)
RDW: 21.5 % — AB (ref 11.5–15.5)
RDW: 21.5 % — ABNORMAL HIGH (ref 11.5–15.5)
WBC: 0.9 10*3/uL — AB (ref 4.0–10.5)
WBC: 1.3 10*3/uL — CL (ref 4.0–10.5)

## 2013-10-28 LAB — PREPARE PLATELET PHERESIS: Unit division: 0

## 2013-10-28 MED ORDER — LORAZEPAM 1 MG PO TABS: 1.0000 mg | ORAL_TABLET | Freq: Four times a day (QID) | ORAL | Status: DC | PRN

## 2013-10-28 MED ORDER — POTASSIUM CHLORIDE 10 MEQ/100ML IV SOLN
10.0000 meq | INTRAVENOUS | Status: AC
  Administered 2013-10-28: 10 meq via INTRAVENOUS
  Filled 2013-10-28: qty 100

## 2013-10-28 MED ORDER — WHITE PETROLATUM GEL
Status: AC
  Administered 2013-10-28: 20:00:00
  Filled 2013-10-28: qty 5

## 2013-10-28 MED ORDER — MORPHINE SULFATE 10 MG/5ML PO SOLN
5.0000 mg | ORAL | Status: DC | PRN
Start: 2013-10-28 — End: 2013-10-29
  Administered 2013-10-28: 5 mg via ORAL
  Filled 2013-10-28: qty 5

## 2013-10-28 NOTE — Progress Notes (Signed)
Patient ID: Jaclyn Hernandez  female  OBS:[id number]    DOB: 1940-02-03    DOA: 10/21/2013  PCP: Kevan Ny, MD  Brief narrative:  73 year old BF PMHx diabetes and advanced Alzheimer disease as well as polymyalgia rheumatica. Lives at home with husband, She was found to have progressive altered mentation, lethargy and inability to eat. At baseline she ambulates with a walker. For the past 2 weeks the family has noted the patient becoming less ambulatory. Over the past 24 hours prior to admission, she became less responsive and not eating.  Upon arrival to the ER patient underwent a CT of the head which showed an multi-compartmental acute subarachnoid hemorrhages. Neurology was consulted. She was also found to have an elevated blood sugar of 400. Patient was noted to be pancytopenic. She has had chronic thrombocytopenia since 2012 but the leukopenia was new. Her white blood cell count was 700 with a low absolute neutrophil count. In the ER the patient was also noted to be febrile with a temperature of 101.3 and tachycardic. Her sodium was elevated at 149 with elevated ALT and AST, mildly elevated creatinine are concerning for volume depletion.   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 No right MCA aneurysm identified.  On 10/21 Patient developed hypernatremia Na 158, PLT dropped to 39,000, given the acute intracranial bleeding, patient was transfused platelets pheresis. Patient subsequently became alert and returned to her baseline mental status. She was transferred out to the floor.   On 10/25/2013 patient had again steep functional decline, difficult to arouse or follow commands, not taking PO, vitals signs remained stable. On exam she was moving all extremities. UA and chest x-ray did not reveal  infection. CT scan of her brain showed no significant change in appearance of SAH. Neurology was reconsulted and recommended EEG.  EEG showed diffuse cerebral dysfunction, nonspecific, no epileptic seizures   Assessment/Plan: Principal Problem:   Acute encephalopathy in the setting of advanced dementia and recent subarachnoid hemorrhage - Patient has history of advanced dementia, brought in due to acute encephalopathy, metabolic with subarachnoid hemorrhage - Chest x-ray UA negative repeat CT of the brain showed no significant change in appearance of [facility] - Neurology consulted, EEG done, showed diffuse cerebral dysfunction, nonspecific, no electrographic seizures - Blood cultures negative so far  - No significant improvement, discussed in detail with the patient's husband at the bedside, palliative medicine consulted.  Active Problems:   SAH (subarachnoid hemorrhage) - Appreciate neurology assistance-has tiny left PCA aneurysm which according to neurology is not the etiology for the patient's subarachnoid hemorrhage - Family had reported recent fall and SAH could be posttraumatic-no MCA aneurysm on CT angio-repeat CT brain 10/21 with decreased volume of SAH - repeat CT scan 10/25/2013 showed little change in appearance of subarachnoid hemorrhage at the right sylvian fissure, no new intraparenchymal abnormalities.   Pancytopenia with thrombocytopenia - History of chronic thrombocytopenia since 2012 , in March of this year platelets 44,000 - Patient received 1 unit of plateletpheresis during hospitalization - patient may have evolving myelodysplastic syndrome, hematology was consulted. Per hematology, may require bone marrow biopsy, workup can completed in the outpatient setting/Dr. Magrinat to arrange for outpatient followup - transfused platelets on 10/26  Hypernatremia /Dehydration with mild acute renal failure  Resolved  Hypokalemia   replaced  Nutrition - no artificial  feeding per  patient's husband wishes -I discussed in detail with patient's husband at the bedside today, palliative medicine consult also obtained, patient has overall poor prognosis given her Alzheimer's advanced dementia, pancytopenia, myelodysplastic syndrome, subarachnoid hemorrhage and acute encephalopathy. After detailed discussion, patient's husband stated that "she would not want to live like this, would not want artificial feeding". He requested hospice at home for end of life, hospital bed. He does not want residential hospice or skilled nursing facility.   Diabetes mellitus  Was on Amaryl and Januvia pre-admission-continue sliding scale insulin-hemoglobin A1c 7.3  Polymyalgia rheumatica on chronic prednisone  NPO   Advanced Alzheimer's dementia  NPO  DVT Prophylaxis:  Code Status: DO NOT RESUSCITATE   Family Communication: discussed in detail with patient's husband, requesting for hospice at home with hospital bed, please see discussion in the nutrition section. Palliative medicine also following.   Disposition:  Consultants:  Neurology  Hematology  Procedures:  None  Antibiotics:  None  Subjective: Patient still lethargic, not following any verbal commands, husband at the bedside  Objective: Weight change:  No intake or output data in the 24 hours ending 10/28/13 1336 Blood pressure 121/69, pulse 105, temperature 97.6 F (36.4 C), temperature source Oral, resp. rate 17, height 5' (1.524 m), weight 51.6 kg (113 lb 12.1 oz), SpO2 100.00%.  Physical Exam: General: Lethargic, and not following any verbal commands CVS: S1-S2 clear Chest: clear to auscultation bilaterally ant Abdomen: soft, ND, NBS Extremities: no c/c/e bilaterally Neuro: Not following any commands  Lab Results: Basic Metabolic Panel:  Recent Labs Lab 10/24/13 0314  10/27/13 0548 10/28/13 0506  NA 142  < > 138 138  K 2.8*  < > 3.3* 3.3*  CL 103  < > 103 99  CO2 27  < > 23 23  GLUCOSE 235*  <  > 101* 97  BUN 15  < > 10 9  CREATININE 0.47*  < > 0.42* 0.42*  CALCIUM 7.8*  < > 7.4* 8.0*  MG 2.2  --   --   --   < > = values in this interval not displayed. Liver Function Tests:  Recent Labs Lab 10/23/13 0300 10/24/13 0314  AST 107* 107*  ALT 90* 86*  ALKPHOS 59 69  BILITOT 0.3 1.1  PROT 5.5* 6.1  ALBUMIN 2.1* 2.4*   No results found for this basename: LIPASE, AMYLASE,  in the last 168 hours No results found for this basename: AMMONIA,  in the last 168 hours CBC:  Recent Labs Lab 10/25/13 1142  10/27/13 1632 10/28/13 0506  WBC 0.9*  < > 0.9* 1.3*  NEUTROABS 0.6*  --   --   --   HGB 11.5*  < > 9.4* 9.4*  HCT 32.9*  < > 27.9* 28.0*  MCV 92.4  < > 95.2 96.9  PLT 96*  < > 94* 80*  < > = values in this interval not displayed. Cardiac Enzymes: No results found for this basename: CKTOTAL, CKMB, CKMBINDEX, TROPONINI,  in the last 168 hours BNP: No components found with this basename: POCBNP,  CBG:  Recent Labs Lab 10/27/13 2017 10/27/13 2355 10/28/13 0413 10/28/13 0737 10/28/13 1145  GLUCAP 156* 132* 110* 100* 132*     Micro Results: Recent Results (from the past 240 hour(s))  URINE CULTURE     Status: None   Collection Time    10/21/13 11:35 AM      Result Value Ref Range Status   Specimen Description URINE, RANDOM  Final   Special Requests ADDED [id number]   Final   Culture  Setup Time     Final   Value: 10/21/2013 17:42     Performed at [place]     Final   Value: NO GROWTH     Performed at [facility]   Culture     Final   Value: NO GROWTH     Performed at [facility]   Report Status 10/22/2013 FINAL   Final  CULTURE, BLOOD (ROUTINE X 2)     Status: None   Collection Time    10/21/13 12:00 PM      Result Value Ref Range Status   Specimen Description BLOOD RIGHT ANTECUBITAL   Final   Special Requests BOTTLES DRAWN AEROBIC AND ANAEROBIC 5CC   Final   Culture  Setup Time     Final   Value:  10/21/2013 17:33     Performed at [facility]   Culture     Final   Value: NO GROWTH 5 DAYS     Performed at [facility]   Report Status 10/27/2013 FINAL   Final  CULTURE, BLOOD (ROUTINE X 2)     Status: None   Collection Time    10/21/13 12:08 PM      Result Value Ref Range Status   Specimen Description BLOOD HAND RIGHT   Final   Special Requests BOTTLES DRAWN AEROBIC ONLY 5CC   Final   Culture  Setup Time     Final   Value: 10/21/2013 17:33     Performed at [facility]   Culture     Final   Value: NO GROWTH 5 DAYS     Performed at [facility]   Report Status 10/27/2013 FINAL   Final  MRSA PCR SCREENING     Status: None   Collection Time    10/22/13 10:12 AM      Result Value Ref Range Status   MRSA by PCR NEGATIVE  NEGATIVE Final   Comment:            The GeneXpert MRSA Assay (FDA     approved for NASAL specimens     only), is one component of a     comprehensive MRSA colonization     surveillance program. It is not     intended to diagnose MRSA     infection nor to guide or     monitor treatment for     MRSA infections.  CULTURE, BLOOD (ROUTINE X 2)     Status: None   Collection Time    10/25/13 11:35 AM      Result Value Ref Range Status   Specimen Description BLOOD RIGHT HAND   Final   Special Requests BOTTLES DRAWN AEROBIC AND ANAEROBIC 10CC EACH   Final   Culture  Setup Time     Final   Value: 10/25/2013 18:04     Performed at [facility]   Culture     Final   Value:        BLOOD CULTURE RECEIVED NO GROWTH TO DATE CULTURE WILL BE HELD FOR 5 DAYS BEFORE ISSUING A FINAL NEGATIVE REPORT     Performed at [facility]   Report Status PENDING   Incomplete  CULTURE, BLOOD (ROUTINE X 2)     Status: None   Collection Time    10/25/13  1:40 PM  Result Value Ref Range Status   Specimen Description BLOOD RIGHT HAND   Final   Special Requests BOTTLES DRAWN AEROBIC ONLY 3CC   Final   Culture  Setup Time     Final     Value: 10/25/2013 18:13     Performed at [facility]   Culture     Final   Value:        BLOOD CULTURE RECEIVED NO GROWTH TO DATE CULTURE WILL BE HELD FOR 5 DAYS BEFORE ISSUING A FINAL NEGATIVE REPORT     Performed at [facility]   Report Status PENDING   Incomplete    Studies/Results: Ct Angio Head W/cm &/or Wo Cm  10/21/2013   CLINICAL DATA:  Intracerebral hemorrhage.  EXAM: CT ANGIOGRAPHY HEAD  TECHNIQUE: Multidetector CT imaging of the head was performed using the standard protocol during bolus administration of intravenous contrast. Multiplanar CT image reconstructions and MIPs were obtained to evaluate the vascular anatomy.  CONTRAST:  80m OMNIPAQUE IOHEXOL 350 MG/ML SOLN  COMPARISON:  Head CT 10/21/2013 and MRA 08/28/2007  FINDINGS: Acute subarachnoid hemorrhage does not appear significantly changed from recent noncontrast head CT, with the greatest volume of hemorrhage present in the right sylvian fissure extending into right frontoparietal sulci. A small amount of hemorrhage is also again seen in left temporal sulci, as well as at the posterior aspect of the right cerebellum. Small amount of intraventricular hemorrhage is again noted in the occipital horns of the lateral ventricles. No abnormal enhancement is identified. There is moderate cerebral atrophy with moderate chronic small vessel ischemic disease. There is no evidence of acute large territory infarct, mass, or midline shift.  Prior bilateral cataract extraction is noted. Mastoid air cells and visualized paranasal sinuses are clear.  Visualized distal vertebral arteries are patent and codominant. Left PICA origin is patent. Right PICA origin is not clearly identified although it may arise from the right AICA, which is patent and dominant. Basilar artery is patent without stenosis. SCA origins are patent. There are medium sized posterior communicating arteries bilaterally with fetal type origins of the PCAs. Left P1  segment is hypoplastic. Right P1 segment is either severely hypoplastic or absent. PCAs are otherwise unremarkable.  Internal carotid arteries are patent from skullbase to carotid termini without stenosis. Minimal intracranial ICA calcification is noted. There is a 2 x 2 mm posteroinferiorly directed outpouching from the left supraclinoid ICA at the posterior communicating artery origin. ACAs and MCAs are unremarkable. Specifically, no right MCA aneurysm is identified in the region of greatest hemorrhage in the right sylvian fissure.  Review of the MIP images confirms the above findings.  IMPRESSION: 1. 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 This may be incidental given its location relative to the acute subarachnoid hemorrhage. No right MCA aneurysm identified. 2. No major intracranial arterial occlusion or significant stenosis. 3. No significant interval change in acute subarachnoid hemorrhage and small volume intraventricular hemorrhage.   Electronically Signed   By: ALogan Bores  On: 10/21/2013 16:46   Dg Chest 2 View  10/25/2013   CLINICAL DATA:  Fever, weakness, unable to hold arms up, personal history of diabetes mellitus, Alzheimer's  EXAM: CHEST  2 VIEW  COMPARISON:  10/21/2013  FINDINGS: Normal heart size, mediastinal contours, and pulmonary vascularity.  Bronchitic changes with minimal bibasilar atelectasis.  Lungs otherwise clear.  No pleural effusion or pneumothorax.  Bones demineralized.  IMPRESSION: Bronchitic changes with bibasilar atelectasis.   Electronically Signed   By:  Lavonia Dana M.D.   On: 10/25/2013 13:57   Dg Chest 2 View  10/21/2013   CLINICAL DATA:  Altered level of consciousness, mental status change ; history of TIAs and diabetes  EXAM: CHEST  2 VIEW  COMPARISON:  PA and lateral chest x-ray of December 18, 2008  FINDINGS: The lungs are borderline hypoinflated. There is no focal infiltrate. There is stable subcentimeter parenchymal density in the right mid lung  consistent with scarring. There is minimal bibasilar linear increased density consistent with atelectasis. The cardiac silhouette is normal in size. The pulmonary vascularity is not engorged. The mediastinum is normal in width. There is no pleural effusion. The bony thorax is unremarkable.  IMPRESSION: There is no evidence of pneumonia nor CHF nor other acute cardiopulmonary abnormality. There is minimal bibasilar atelectasis or scarring.   Electronically Signed   By: David  Martinique   On: 10/21/2013 13:25   Ct Head Wo Contrast  10/25/2013   CLINICAL DATA:  Followup acute encephalopathy, recent intracranial hemorrhage, decline in mental status today, history Alzheimer's disease, anterior communicating artery aneurysm, diabetes mellitus  EXAM: CT HEAD WITHOUT CONTRAST  TECHNIQUE: Contiguous axial images were obtained from the base of the skull through the vertex without intravenous contrast.  COMPARISON:  10/22/2013 for an  FINDINGS: Generalized atrophy.  Normal ventricular morphology.  No midline shift or mass effect.  Small vessel chronic ischemic changes of deep cerebral white matter.  Subarachnoid hemorrhage again identified within the RIGHT sylvian fissure and within a few sulci at the posterior parietal lobes bilaterally, LEFT temporal lobe, and RIGHT frontal lobe.  No definite intraparenchymal hemorrhage, mass lesion, or areas of infarction.  Tiny amount of dependent intraventricular blood at the occipital horn of the RIGHT lateral ventricle.  Bones and sinuses unremarkable.  IMPRESSION: Little change in appearance of subarachnoid hemorrhage at the RIGHT sylvian fissure with additional areas of mildscattered subarachnoid blood at additional sites bilaterally.  Minimal dependent intraventricular blood at the occipital horn of the RIGHT lateral ventricle.  No new intraparenchymal abnormalities.   Electronically Signed   By: Lavonia Dana M.D.   On: 10/25/2013 13:56   Ct Head Wo Contrast  10/22/2013    CLINICAL DATA:  73 year old female with altered mental status, found to have acute intracranial hemorrhage on 10/2013, and left posterior communicating artery aneurysm by CTA that day. Initial encounter.  EXAM: CT HEAD WITHOUT CONTRAST  TECHNIQUE: Contiguous axial images were obtained from the base of the skull through the vertex without intravenous contrast.  COMPARISON:  10/21/2013.  FINDINGS: Visualized paranasal sinuses and mastoids are clear. No acute osseous abnormality identified. No acute orbit or scalp soft tissue findings.  Calcified atherosclerosis at the skull base. Mildly decreased volume of subarachnoid hemorrhage, right sylvian fissure residual. No intraventricular hemorrhage identified today knee. Stable ventricle size and configuration. No midline shift, mass effect, or evidence of intracranial mass lesion. Stable gray-white matter differentiation throughout the brain. No evidence of cortically based acute infarction identified.  IMPRESSION: Slightly decreased volume of subarachnoid hemorrhage. No ventriculomegaly or new intracranial abnormality identified.   Electronically Signed   By: Lars Pinks M.D.   On: 10/22/2013 23:41   Ct Head Wo Contrast  10/21/2013   CLINICAL DATA:  73 year old female with hyperglycemia and altered mental status. History of Alzheimer's.  EXAM: CT HEAD WITHOUT CONTRAST  TECHNIQUE: Contiguous axial images were obtained from the base of the skull through the vertex without intravenous contrast.  COMPARISON:  CT 01/09/2009, 12/23/2008  FINDINGS: Unremarkable  appearance of the calvarium without acute fracture or aggressive lesion. No scalp swelling.  No significant paranasal sinus disease. Mastoid air cells are clear.  Unremarkable appearance of the orbits with bilateral lens extraction.  Multi compartmental acute hemorrhage, with the predominant focus within the right sylvian fissure and right frontal sulci. There also appears to be a small amount of hemorrhage in the right  petroclival region and posterior to the right sella turcica. Small focus of subarachnoid hemorrhage in the sulci of the left temporal lobe. There also appears to be a small amount of intraparenchymal hemorrhage versus subarachnoid hemorrhage of the right cerebellar hemisphere.  Hemorrhage layered within the bilateral posterior horns of the lateral ventricles.  Progression of brain volume loss with expansion of the ventricles. Periventricular hypodensity. Intracranial atherosclerosis.  No midline shift.  IMPRESSION: Multi compartmental acute intracranial hemorrhage, with a largest focus in the right sylvian fissure extending superiorly along the sulci and into the right frontal sulci.  Additional foci of subarachnoid hemorrhage include the left temporal sulci, left sylvian fissure, and along the right petroclival ligament. Focus of hemorrhage within the right cerebellar hemisphere may be subarachnoid or intraparenchymal, and there is also blood products layered within the posterior horn the lateral ventricles.  Progression of senescent brain volume loss, periventricular white matter and associated intracranial atherosclerosis.  These results were called by telephone at the time of interpretation on 10/21/2013 at 1:34 pm to Dr. Domenic Moras , who verbally acknowledged these results.  Signed,  Dulcy Fanny. Earleen Newport, DO  [facility]  [facility]   Electronically Signed   By: Corrie Mckusick D.O.   On: 10/21/2013 13:37    Medications: Scheduled Meds: . antiseptic oral rinse  7 mL Mouth Rinse q12n4p  . ceFEPime (MAXIPIME) IV  2 g Intravenous Q12H  . chlorhexidine  15 mL Mouth Rinse BID  . citalopram  20 mg Oral Daily  . feeding supplement (GLUCERNA SHAKE)  237 mL Oral TID BM  . Influenza vac split quadrivalent PF  0.5 mL Intramuscular Tomorrow-1000  . insulin aspart  0-9 Units Subcutaneous 6 times per day  . insulin glargine  10 Units Subcutaneous QHS  .  lipase/protease/amylase  24,000 Units Oral BID AC  . Memantine HCl ER  28 mg Oral Daily  . predniSONE  15 mg Oral Q breakfast  . rivastigmine  3 mg Oral BID  . vancomycin  750 mg Intravenous Q24H      LOS: 7 days   Tajuan Dufault M.D. [facility] 10/28/2013, 1:36 PM Pager: [phone]  If 7PM-7AM, please contact night-coverage [email or web address] Password [email or web address]

## 2013-10-28 NOTE — Progress Notes (Signed)
NEURO HOSPITALIST PROGRESS NOTE   SUBJECTIVE:                                                                                                                        Asleep with eyes closed. No verbal output. Not responding to voice. Moans to noxious stimuli.  EEG shows "moderate diffuse slowing with occasional triphasic waves".    OBJECTIVE:                                                                                                                           Vital signs in last 24 hours: Temp:  [97.6 F (36.4 C)-102.9 F (39.4 C)] 97.6 F (36.4 C) (10/27 0947) Pulse Rate:  [100-109] 105 (10/27 0947) Resp:  [17-20] 17 (10/27 0947) BP: (104-137)/(54-69) 121/69 mmHg (10/27 0947) SpO2:  [100 %] 100 % (10/27 0947)  Intake/Output from previous day: 10/26 0701 - 10/27 0700 In: 563 [Blood:563] Out: -  Intake/Output this shift:   Nutritional status: Dysphagia  Past Medical History  Diagnosis Date  . Diabetes mellitus   . Alzheimer's disease   . Urinary tract infection, site not specified   . Hematuria, unspecified   . Chronic fatigue syndrome   . Alzheimer's dementia   . Colonic dysfunction   . Fibromyalgia   . Anemia   . Polymyalgia rheumatica   . Depression   . Anxiety   . Pancreatic insufficiency   . Unspecified transient cerebral ischemia    Physical exam: no apparent distress.  Head: normocephalic.  Neck: mildly stiff, no bruits, no JVD.  Cardiac: no murmurs.  Lungs: clear.  Abdomen: soft, no tender, no mass.  Extremities: no edema LE.    Neurologic Exam:  Mental status: she is not opening her eyes or following commands. No verbal output. Moans to noxious stimuli CN 2-12: pupils seem to be symmetric, EOM appear to be preserved, no nystagmus. Face symmetric. Tongue midline.  Motor: minimal motor spontaneous movements.  Sensory: reacts to pain with moan and mild withdrawal symmetric in extremities DTR's: 1+ all over.   Plantars: left upgoing, right downgoing.  Coordination and gait: unable to test No meningeal irritation signs.   Lab Results: Lab Results  Component Value Date/Time   CHOL 236*  02/08/2012   Lipid Panel No results found for this basename: CHOL, TRIG, HDL, CHOLHDL, VLDL, LDLCALC,  in the last 72 hours  Studies/Results: No results found.  MEDICATIONS                                                                                                                        Scheduled: . antiseptic oral rinse  7 mL Mouth Rinse q12n4p  . ceFEPime (MAXIPIME) IV  2 g Intravenous Q12H  . chlorhexidine  15 mL Mouth Rinse BID  . citalopram  20 mg Oral Daily  . feeding supplement (GLUCERNA SHAKE)  237 mL Oral TID BM  . Influenza vac split quadrivalent PF  0.5 mL Intramuscular Tomorrow-1000  . insulin aspart  0-9 Units Subcutaneous 6 times per day  . insulin glargine  10 Units Subcutaneous QHS  . lipase/protease/amylase  24,000 Units Oral BID AC  . Memantine HCl ER  28 mg Oral Daily  . predniSONE  15 mg Oral Q breakfast  . rivastigmine  3 mg Oral BID  . vancomycin  750 mg Intravenous Q24H    ASSESSMENT/PLAN:                                                                                                           73 y/o with advanced dementia brought in due to altered mental status found to have multifocal SAH on CT, no evidence of MCA aneyrsm but small 22mm left PCOM aneurysm that does not explain the location of SAH. Unclear etiology of subsequent decline in mental status, suspect likely multifactorial (underlying dementia, SAH). EEG shows moderate slowing with occasional triphasic waves, no seizure activity.   Had a long discussion with patients spouse who wishes to proceed with palliative care/hospice. He does not wish for any further intervention at this time. Will sign off at this time.      Jim Like, DO [facility] [phone]  If 7pm- 7am, please page neurology on  call as listed in [place].   10/28/2013, 10:04 AM

## 2013-10-28 NOTE — Progress Notes (Signed)
PT Cancellation Note  Patient Details Name: Jaclyn Hernandez MRN: [id number] DOB: 05/26/1940   Cancelled Treatment:    Reason Eval/Treat Not Completed: Medical issues which prohibited therapy Pt not responding to voice or touch at this time and not following any commands. No changes in function since previous attempt. Pt responds to noxious stimuli by producing a low moan. Will sign off for now due to inability to participate. Please re-consult when appropriate. Discussed with family.    Candy Sledge A 10/28/2013, 3:19 PM Candy Sledge, [place], DPT [phone]

## 2013-10-28 NOTE — Progress Notes (Signed)
Notified by [name], patient and family request services of [facility]) after discharge. Patient information reviewed with Dr Alferd Patee, [place] Director hospice eligibility confirmed.  Pt seen at bedside, unresponsive to voice or light touch; spoke with husband at bedside to initiate education related to hospice services, philosophy and team approach to care; he voiced good understanding of information provided.  Mr Jaclyn Hernandez shared that prior to this event his wife had been declining from advancing Alzheimer's; he shared he was initially hopeful that she would return to where she was and is overwhelmed by her sudden decline. 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 Mr Manzer shared he and Jaclyn Hernandez have been together for 32 years and he feels her EOL wishes would be for dignity and comfort. He informed he would not want any PEG/Tube Feeding or IV Fluids; he confirmed they want a DNR code status -His wish is to take her home and if he finds that he cannot handle her care needs he would want to transition to [facility] for EOL care.  Per discussion plan is for discharge tomorrow Wednesday 10/29/13 by Corey Harold *Please send signed GOLD DNR Form home with pt *Please send prescriptions for comfort medications per PMT recommendation: Liquid Concentrated Morphine 10 mg/0.5 ml give 5 mg q 1 hr PRN, pain, dyspnea Dispense Quantity = 30 ml Lorazepam tablets 1 mg q 6 hours prn anxiety  DME has been requested for delivery to the home later today: Complete Oxygen Package B: pt is currently on O2 @ 2LNC continuous, Complete Pkg D: Fully electric hospital bed with half rails (top & bottom) AP&P mattress; over-bed table  Writer contacted [employer] requesting DME order be  placed with [facility]  * [facility] to contact pt's husband: Kamariah Fruchter [phone] to arrange delivery time  Patient's PCP Dr Kevan Ny will be attending working with [facility] once pt is home; pharmacy [facility] on [address] Initial paperwork faxed to [facility]  Please notify [facility] when patient is ready to leave unit at d/c call [phone] (or [phone] if after 5 pm);  [facility] information and contact numbers also given to Mr Aguilera during visit.   Above information shared with [facility] Please call with any questions or concerns   Danton Sewer, RN 10/28/2013, 2:02 PM [facility] RN Liaison [phone]

## 2013-10-28 NOTE — Progress Notes (Signed)
Progress Note from the Palliative Medicine Team at [facility]:  patient remains minimally responsive  -husband at bedside tells me he "is taking her home with hospice", he has discussed this with the attending and case management RN  -I raised awareness to the option of in-pateint hospice facility but that is not a consideration at this time,  and he tells me he has CNA  help in the home   Objective: Allergies  Allergen Reactions  . Sulfa Antibiotics Other (See Comments)    unknown   Scheduled Meds: . antiseptic oral rinse  7 mL Mouth Rinse q12n4p  . ceFEPime (MAXIPIME) IV  2 g Intravenous Q12H  . chlorhexidine  15 mL Mouth Rinse BID  . citalopram  20 mg Oral Daily  . feeding supplement (GLUCERNA SHAKE)  237 mL Oral TID BM  . Influenza vac split quadrivalent PF  0.5 mL Intramuscular Tomorrow-1000  . insulin aspart  0-9 Units Subcutaneous 6 times per day  . insulin glargine  10 Units Subcutaneous QHS  . lipase/protease/amylase  24,000 Units Oral BID AC  . Memantine HCl ER  28 mg Oral Daily  . predniSONE  15 mg Oral Q breakfast  . rivastigmine  3 mg Oral BID  . vancomycin  750 mg Intravenous Q24H   Continuous Infusions: . sodium chloride 100 mL/hr at 10/27/13 0933   PRN Meds:.acetaminophen, acetaminophen, albuterol, dextrose  BP 121/69  Pulse 105  Temp(Src) 97.6 F (36.4 C) (Oral)  Resp 17  Ht 5' (1.524 m)  Wt 51.6 kg (113 lb 12.1 oz)  BMI 22.22 kg/m2  SpO2 100%   PPS:20 %   No intake or output data in the 24 hours ending 10/28/13 1256      Physical Exam:  General: ill appearing, minimally responsive, appears comfortable  HEENT: mosit buccal membraens  Chest: Decreased in bases  CVS: tachycardic Abdomen: soft NT +BS  Ext: wtihout edema  Labs: CBC    Component Value Date/Time   WBC 1.3* 10/28/2013 0506   WBC 5.6 12/18/2011   RBC 2.89* 10/28/2013 0506   RBC 3.12* 10/24/2013 0314   HGB 9.4* 10/28/2013 0506   HCT 28.0* 10/28/2013 0506   PLT  80* 10/28/2013 0506   MCV 96.9 10/28/2013 0506   MCH 32.5 10/28/2013 0506   MCHC 33.6 10/28/2013 0506   RDW 21.5* 10/28/2013 0506   LYMPHSABS 0.3* 10/25/2013 1142   MONOABS 0.0* 10/25/2013 1142   EOSABS 0.0 10/25/2013 1142   BASOSABS 0.0 10/25/2013 1142    BMET    Component Value Date/Time   NA 138 10/28/2013 0506   NA 139 02/08/2012   K 3.3* 10/28/2013 0506   CL 99 10/28/2013 0506   CO2 23 10/28/2013 0506   GLUCOSE 97 10/28/2013 0506   BUN 9 10/28/2013 0506   BUN 22* 02/08/2012   CREATININE 0.42* 10/28/2013 0506   CREATININE 1.0 02/08/2012   CALCIUM 8.0* 10/28/2013 0506   GFRNONAA >90 10/28/2013 0506   GFRAA >90 10/28/2013 0506    CMP     Component Value Date/Time   NA 138 10/28/2013 0506   NA 139 02/08/2012   K 3.3* 10/28/2013 0506   CL 99 10/28/2013 0506   CO2 23 10/28/2013 0506   GLUCOSE 97 10/28/2013 0506   BUN 9 10/28/2013 0506   BUN 22* 02/08/2012   CREATININE 0.42* 10/28/2013 0506   CREATININE 1.0 02/08/2012   CALCIUM 8.0* 10/28/2013 0506   PROT 6.1 10/24/2013 0314   ALBUMIN 2.4* 10/24/2013  0314   AST 107* 10/24/2013 0314   ALT 86* 10/24/2013 0314   ALKPHOS 69 10/24/2013 0314   BILITOT 1.1 10/24/2013 0314   GFRNONAA >90 10/28/2013 0506   GFRAA >90 10/28/2013 0506     Assessment and Plan: 1. Code Status:DNR/DNI-comfort is main focus of care 2. Symptom Control:*         Pain/Dyspnea: Roxanol 5 mg po/sl every 1 hr prn         Anxiety/agitation: Ativan 1 mg po/sl every 4 hrs prn  3. Psycho/Social:  Emotional support offered to husband, we discussed the natural trajectory and expectations  at EOL 4. Spiritual   Strong community church support 5. Disposition:   Home with hospice, attending team has initiated  Patient Documents Completed or Given: Document Given Completed  Advanced Directives Pkt    MOST X   DNR    Gone from My Sight    Hard Choices X     Time In Time Out Total Time Spent with Patient Total Overall Time  1200 1225 25 min 25 min     Greater than 50%  of this time was spent counseling and coordinating care related to the above assessment and plan.  Wadie Lessen NP  Palliative Medicine Team Team Phone # [phone] Pager [phone]  1

## 2013-10-28 NOTE — Progress Notes (Signed)
Talked to patient's spouse about DCP; patient wants to take the patient home with hospice services; Hospice choices offered, spouse chose [facility]; [place] with [facility] called and made aware, she will see the pt/ spouse todayAneta Mins [phone]

## 2013-10-29 DIAGNOSIS — R0600 Dyspnea, unspecified: Secondary | ICD-10-CM

## 2013-10-29 LAB — GLUCOSE, CAPILLARY: GLUCOSE-CAPILLARY: 211 mg/dL — AB (ref 70–99)

## 2013-10-29 LAB — CBC
HCT: 26 % — ABNORMAL LOW (ref 36.0–46.0)
Hemoglobin: 8.8 g/dL — ABNORMAL LOW (ref 12.0–15.0)
MCH: 32.1 pg (ref 26.0–34.0)
MCHC: 33.8 g/dL (ref 30.0–36.0)
MCV: 94.9 fL (ref 78.0–100.0)
PLATELETS: 39 10*3/uL — AB (ref 150–400)
RBC: 2.74 MIL/uL — AB (ref 3.87–5.11)
RDW: 21.2 % — AB (ref 11.5–15.5)
WBC: 0.9 10*3/uL — AB (ref 4.0–10.5)

## 2013-10-29 LAB — BASIC METABOLIC PANEL
Anion gap: 12 (ref 5–15)
BUN: 10 mg/dL (ref 6–23)
CALCIUM: 7.7 mg/dL — AB (ref 8.4–10.5)
CO2: 25 mEq/L (ref 19–32)
Chloride: 102 mEq/L (ref 96–112)
Creatinine, Ser: 0.5 mg/dL (ref 0.50–1.10)
GFR calc non Af Amer: >90 mL/min (ref >90)
Glucose, Bld: 224 mg/dL — ABNORMAL HIGH (ref 70–99)
Potassium: 2.9 mEq/L — CL (ref 3.7–5.3)
SODIUM: 139 meq/L (ref 137–147)

## 2013-10-29 MED ORDER — LORAZEPAM 0.5 MG PO TABS: 0.5000 mg | ORAL_TABLET | Freq: Four times a day (QID) | ORAL | Status: AC | PRN

## 2013-10-29 MED ORDER — ACETAMINOPHEN 650 MG RE SUPP: 650.0000 mg | Freq: Four times a day (QID) | RECTAL | Status: AC | PRN

## 2013-10-29 MED ORDER — MORPHINE SULFATE 10 MG/5ML PO SOLN: 5.0000 mg | Freq: Four times a day (QID) | ORAL | Status: AC | PRN

## 2013-10-29 NOTE — Progress Notes (Signed)
Patients temperature above 100 gave 650 mg tylenol suppository now under 100 will continue to monitor.

## 2013-10-29 NOTE — Progress Notes (Signed)
Patient is discharged from room 4N26 at this time. Responds to tactile stimuli. IV site d/c'd. Transported by PTAR via stretcher with husband and all belongings at side. Instructions read to husband and understanding verbalized.

## 2013-10-29 NOTE — Progress Notes (Signed)
Follow-up: spoke with pt's husband at bedside; all DME has been delivered to the home; Mr Ewy is aware that Ms Hai had fevers over nite and has received Acetaminophen PR; he requests Rx for Acetaminophen suppositories at home; he is aware he will receive additional Rx for comfort medications: Liquid Concentrated Morphine and Ativan   Discussed pt's potassium level was low this morning and husband stated he had talked about this to the team yesterday and informed he did not want any potassium given and was not sure why lab work was done; husband stated I had decided no lab work; no IV Fluids, etc I just want her to be at home an be comfortable till it's her time.  Discussed briefly with Mr Isais his observation that pt did open her L eye when he spoke to her this morning and taht she seems more restless (moving legs intermittently); also discussed that he may see changes daily and encouraged Mr Ports if when at home he was concerned about any observations or changes in condition to call the [facility] On-Call Nurse and he voiced understanding. Mr Glendenning is aware Attending, Dr Coralyn Pear will be in to see him and pt this morning and will co-ordinate discharge needs. [facility] aware of planned discharge today by PTAR and Admission RN to see pt at the home late afternoon.  Please call with any questions Danton Sewer, RN MSN [facility] Liaison [phone]

## 2013-10-29 NOTE — Discharge Summary (Signed)
Physician Discharge Summary  Jaclyn Hernandez PFX:[id number] DOB: 09-Jun-1940 DOA: 10/21/2013  PCP: Kevan Ny, MD  Admit date: 10/21/2013 Discharge date: 10/29/2013  Time spent: 35 minutes  Recommendations for Outpatient Follow-up:  1. Patient to be discharge home with hospice services.   Discharge Diagnoses:  Principal Problem:   Acute encephalopathy Active Problems:   SAH (subarachnoid hemorrhage)   DM (diabetes mellitus)   Polymyalgia rheumatica   Alzheimer's dementia   Pancytopenia   Dehydration   Palliative care encounter   DNR (do not resuscitate)   Weakness generalized   Dyspnea   Discharge Condition: Patient going home with Hospice services.   Diet recommendation: As tolerated  Filed Weights   10/21/13 2000 10/24/13 1449  Weight: 50.2 kg (110 lb 10.7 oz) 51.6 kg (113 lb 12.1 oz)    History of present illness:  Jaclyn Hernandez is a 73 y.o. female, with known past medical history for diabetes mellitus, advanced Alzheimer's dementia, polymyalgia rheumatica, presents with altered mental status, patient has advanced dementia at baseline, brought by her husband as she was more lethargic, has not been able to eat bronchitic over the last 24 hours, patient reports usually patient ambulates with walker with full assistance, the last 2 weeks she became less ambulatory, she Days ago, became less responsive, not eating, upon presentation to ED patient had CT head which did show multi compartmental acute intracranial hemorrhage, with a largest focus in the right sylvian fissure extending superiorly along the sulci and into the right frontal sulci.Additional foci of subarachnoid hemorrhage include the left temporal sulci, left sylvian fissure, and along the right petroclival ligament. Focus of hemorrhage within the right cerebellar hemisphere may be subarachnoid or intraparenchymal, and there is also blood products layered within the posterior horn the lateral ventricles.  As well  patient was noticed to have elevated blood sugar in the 400s. As well patient was noticed to have thrombocytopenia which appears to be chronic, as well she has leukopenia with white blood cell count of 0.7.  In the ED: Pt cbg 593, temp 101.3 and tachycardic.  Na 149, Cr 1.15, ALT 110, AST 119. Platelets 51.    Hospital Course:  73 year old BF PMHx diabetes and advanced Alzheimer disease as well as polymyalgia rheumatica. Lives at home with husband, She was found to have progressive altered mentation, lethargy and inability to eat. At baseline she ambulates with a walker. For the past 2 weeks the family has noted the patient becoming less ambulatory. Over the past 24 hours prior to admission, she became less responsive and not eating.  Upon arrival to the ER patient underwent a CT of the head which showed an multi-compartmental acute subarachnoid hemorrhages. Neurology was consulted. She was also found to have an elevated blood sugar of 400. Patient was noted to be pancytopenic. She has had chronic thrombocytopenia since 2012 but the leukopenia was new. Her white blood cell count was 700 with a low absolute neutrophil count. In the ER the patient was also noted to be febrile with a temperature of 101.3 and tachycardic. Her sodium was elevated at 149 with elevated ALT and AST, mildly elevated creatinine are concerning for volume depletion.  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 No right MCA aneurysm identified.  On 10/21 Patient developed hypernatremia Na 158, PLT dropped to 39,000, given the acute  intracranial bleeding, patient was transfused platelets pheresis.  Patient subsequently became alert and returned to her baseline mental status. She was transferred out to the floor.  On 10/25/2013 patient had  again steep functional decline, difficult to arouse or follow commands, not taking PO, vitals signs remained stable. On exam she was moving all extremities. UA and chest x-ray did not reveal infection. CT scan of her brain showed no significant change in appearance of SAH. Neurology was reconsulted and recommended EEG. EEG showed diffuse cerebral dysfunction, nonspecific, no epileptic seizures. Goals of care were discussed with her husband who wished to focus care on call for rather than pursuing any further burdensome or invasive interventions. Palliative care consulted. Patient was transitioned to comfort. Family members wishing for her to return home with home hospice following. He was discharged to her home on a 10/29/2013.   Acute encephalopathy in the setting of advanced dementia and recent subarachnoid hemorrhage  - Patient has history of advanced dementia, brought in due to acute encephalopathy, metabolic with subarachnoid hemorrhage  - Chest x-ray UA negative repeat CT of the brain showed no significant change in appearance of [facility]  - Neurology consulted, EEG done, showed diffuse cerebral dysfunction, nonspecific, no electrographic seizures  - Blood cultures negative so far  - No significant improvement, discussed in detail with the patient's husband at the bedside, palliative medicine consulted.  -Transitioned to comfort care Active Problems:  SAH (subarachnoid hemorrhage)  - Appreciate neurology assistance-has tiny left PCA aneurysm which according to neurology is not the etiology for the patient's subarachnoid hemorrhage  - Family had reported recent fall and SAH could be posttraumatic-no MCA aneurysm on CT angio-repeat CT brain 10/21 with decreased volume of SAH  - repeat CT scan 10/25/2013 showed little change in appearance of subarachnoid hemorrhage at the right sylvian fissure, no new intraparenchymal abnormalities.   Pancytopenia with thrombocytopenia  - History of chronic  thrombocytopenia since 2012 , in March of this year platelets 44,000  - Patient received 1 unit of plateletpheresis during hospitalization  - patient may have evolving myelodysplastic syndrome, hematology was consulted. Per hematology, may require bone marrow biopsy, workup can completed in the outpatient setting/Dr. Magrinat to arrange for outpatient followup  - transfused platelets on 10/26  Hypernatremia /Dehydration with mild acute renal failure  Resolved  Hypokalemia  replaced  Nutrition  - no artificial feeding per patient's husband wishes  -I discussed in detail with patient's husband at the bedside today, palliative medicine consult also obtained, patient has overall poor prognosis given her Alzheimer's advanced dementia, pancytopenia, myelodysplastic syndrome, subarachnoid hemorrhage and acute encephalopathy. After detailed discussion, patient's husband stated that "she would not want to live like this, would not want artificial feeding". He requested hospice at home for end of life, hospital bed. He does not want residential hospice or skilled nursing facility.   Consultations:  Neurology  Hematology  Discharge Exam: Filed Vitals:   10/29/13 0922  BP: 109/53  Pulse: 93  Temp: 99.3 F (37.4 C)  Resp: 21    General: Lethargic, and not following any verbal commands  CVS: S1-S2 clear  Chest: clear to auscultation bilaterally ant  Abdomen: soft, ND, NBS  Extremities: no c/c/e bilaterally  Neuro: Not following any commands   Discharge Instructions You were cared for by a hospitalist during your hospital stay. If you have any questions about your discharge medications or the care you received while you were in the hospital after you are discharged, you can call the unit  and asked to speak with the hospitalist on call if the hospitalist that took care of you is not available. Once you are discharged, your primary care physician will handle any further medical issues. Please  note that NO REFILLS for any discharge medications will be authorized once you are discharged, as it is imperative that you return to your primary care physician (or establish a relationship with a primary care physician if you do not have one) for your aftercare needs so that they can reassess your need for medications and monitor your lab values.  Discharge Instructions   (HEART FAILURE PATIENTS) Call MD:  Anytime you have any of the following symptoms: 1) 3 pound weight gain in 24 hours or 5 pounds in 1 week 2) shortness of breath, with or without a dry hacking cough 3) swelling in the hands, feet or stomach 4) if you have to sleep on extra pillows at night in order to breathe.    Complete by:  As directed      Call MD for:  difficulty breathing, headache or visual disturbances    Complete by:  As directed      Call MD for:  extreme fatigue    Complete by:  As directed      Call MD for:  hives    Complete by:  As directed      Call MD for:  persistant dizziness or light-headedness    Complete by:  As directed      Call MD for:  persistant nausea and vomiting    Complete by:  As directed      Call MD for:  redness, tenderness, or signs of infection (pain, swelling, redness, odor or green/yellow discharge around incision site)    Complete by:  As directed      Call MD for:  severe uncontrolled pain    Complete by:  As directed      Call MD for:  temperature >100.4    Complete by:  As directed      Diet - low sodium heart healthy    Complete by:  As directed      Increase activity slowly    Complete by:  As directed           Current Discharge Medication List    START taking these medications   Details  acetaminophen (TYLENOL) 650 MG suppository Place 1 suppository (650 mg total) rectally every 6 (six) hours as needed for mild pain. Qty: 20 suppository, Refills: 0    LORazepam (ATIVAN) 0.5 MG tablet Take 1 tablet (0.5 mg total) by mouth every 6 (six) hours as needed for  anxiety. Qty: 20 tablet, Refills: 0    morphine 10 MG/5ML solution Take 2.5 mLs (5 mg total) by mouth every 6 (six) hours as needed for moderate pain or severe pain (dyspnea). Qty: 100 mL, Refills: 0      CONTINUE these medications which have NOT CHANGED   Details  loperamide (IMODIUM A-D) 2 MG tablet Take 2 mg by mouth 4 (four) times daily as needed for diarrhea or loose stools.      STOP taking these medications     citalopram (CELEXA) 20 MG tablet      glimepiride (AMARYL) 1 MG tablet      Memantine HCl ER (NAMENDA XR) 28 MG CP24      Pancrelipase, Lip-Prot-Amyl, (CREON) 24000 UNITS CPEP      potassium chloride SA (K-DUR,KLOR-CON) 20 MEQ tablet  predniSONE (DELTASONE) 10 MG tablet      rivastigmine (EXELON) 3 MG capsule      sitaGLIPtan (JANUVIA) 100 MG tablet        Allergies  Allergen Reactions  . Sulfa Antibiotics Other (See Comments)    unknown   Follow-up Information   Follow up with Marlou Sa, ERIC, MD In 1 week.   Specialty:  Internal Medicine   Contact information:   [facility] [place] [id number] [phone]        The results of significant diagnostics from this hospitalization (including imaging, microbiology, ancillary and laboratory) are listed below for reference.    Significant Diagnostic Studies: Ct Angio Head W/cm &/or Wo Cm  10/21/2013   CLINICAL DATA:  Intracerebral hemorrhage.  EXAM: CT ANGIOGRAPHY HEAD  TECHNIQUE: Multidetector CT imaging of the head was performed using the standard protocol during bolus administration of intravenous contrast. Multiplanar CT image reconstructions and MIPs were obtained to evaluate the vascular anatomy.  CONTRAST:  25m OMNIPAQUE IOHEXOL 350 MG/ML SOLN  COMPARISON:  Head CT 10/21/2013 and MRA 08/28/2007  FINDINGS: Acute subarachnoid hemorrhage does not appear significantly changed from recent noncontrast head CT, with the greatest volume of hemorrhage present in  the right sylvian fissure extending into right frontoparietal sulci. A small amount of hemorrhage is also again seen in left temporal sulci, as well as at the posterior aspect of the right cerebellum. Small amount of intraventricular hemorrhage is again noted in the occipital horns of the lateral ventricles. No abnormal enhancement is identified. There is moderate cerebral atrophy with moderate chronic small vessel ischemic disease. There is no evidence of acute large territory infarct, mass, or midline shift.  Prior bilateral cataract extraction is noted. Mastoid air cells and visualized paranasal sinuses are clear.  Visualized distal vertebral arteries are patent and codominant. Left PICA origin is patent. Right PICA origin is not clearly identified although it may arise from the right AICA, which is patent and dominant. Basilar artery is patent without stenosis. SCA origins are patent. There are medium sized posterior communicating arteries bilaterally with fetal type origins of the PCAs. Left P1 segment is hypoplastic. Right P1 segment is either severely hypoplastic or absent. PCAs are otherwise unremarkable.  Internal carotid arteries are patent from skullbase to carotid termini without stenosis. Minimal intracranial ICA calcification is noted. There is a 2 x 2 mm posteroinferiorly directed outpouching from the left supraclinoid ICA at the posterior communicating artery origin. ACAs and MCAs are unremarkable. Specifically, no right MCA aneurysm is identified in the region of greatest hemorrhage in the right sylvian fissure.  Review of the MIP images confirms the above findings.  IMPRESSION: 1. 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 This may be incidental given its location relative to the acute subarachnoid hemorrhage. No right MCA aneurysm identified. 2. No major intracranial arterial occlusion or significant stenosis. 3. No significant interval change in acute subarachnoid hemorrhage and small  volume intraventricular hemorrhage.   Electronically Signed   By: ALogan Bores  On: 10/21/2013 16:46   Dg Chest 2 View  10/25/2013   CLINICAL DATA:  Fever, weakness, unable to hold arms up, personal history of diabetes mellitus, Alzheimer's  EXAM: CHEST  2 VIEW  COMPARISON:  10/21/2013  FINDINGS: Normal heart size, mediastinal contours, and pulmonary vascularity.  Bronchitic changes with minimal bibasilar atelectasis.  Lungs otherwise clear.  No pleural effusion or pneumothorax.  Bones demineralized.  IMPRESSION: Bronchitic changes with bibasilar atelectasis.  Electronically Signed   By: Lavonia Dana M.D.   On: 10/25/2013 13:57   Dg Chest 2 View  10/21/2013   CLINICAL DATA:  Altered level of consciousness, mental status change ; history of TIAs and diabetes  EXAM: CHEST  2 VIEW  COMPARISON:  PA and lateral chest x-ray of December 18, 2008  FINDINGS: The lungs are borderline hypoinflated. There is no focal infiltrate. There is stable subcentimeter parenchymal density in the right mid lung consistent with scarring. There is minimal bibasilar linear increased density consistent with atelectasis. The cardiac silhouette is normal in size. The pulmonary vascularity is not engorged. The mediastinum is normal in width. There is no pleural effusion. The bony thorax is unremarkable.  IMPRESSION: There is no evidence of pneumonia nor CHF nor other acute cardiopulmonary abnormality. There is minimal bibasilar atelectasis or scarring.   Electronically Signed   By: David  Martinique   On: 10/21/2013 13:25   Ct Head Wo Contrast  10/25/2013   CLINICAL DATA:  Followup acute encephalopathy, recent intracranial hemorrhage, decline in mental status today, history Alzheimer's disease, anterior communicating artery aneurysm, diabetes mellitus  EXAM: CT HEAD WITHOUT CONTRAST  TECHNIQUE: Contiguous axial images were obtained from the base of the skull through the vertex without intravenous contrast.  COMPARISON:  10/22/2013 for an   FINDINGS: Generalized atrophy.  Normal ventricular morphology.  No midline shift or mass effect.  Small vessel chronic ischemic changes of deep cerebral white matter.  Subarachnoid hemorrhage again identified within the RIGHT sylvian fissure and within a few sulci at the posterior parietal lobes bilaterally, LEFT temporal lobe, and RIGHT frontal lobe.  No definite intraparenchymal hemorrhage, mass lesion, or areas of infarction.  Tiny amount of dependent intraventricular blood at the occipital horn of the RIGHT lateral ventricle.  Bones and sinuses unremarkable.  IMPRESSION: Little change in appearance of subarachnoid hemorrhage at the RIGHT sylvian fissure with additional areas of mildscattered subarachnoid blood at additional sites bilaterally.  Minimal dependent intraventricular blood at the occipital horn of the RIGHT lateral ventricle.  No new intraparenchymal abnormalities.   Electronically Signed   By: Lavonia Dana M.D.   On: 10/25/2013 13:56   Ct Head Wo Contrast  10/22/2013   CLINICAL DATA:  73 year old female with altered mental status, found to have acute intracranial hemorrhage on 10/2013, and left posterior communicating artery aneurysm by CTA that day. Initial encounter.  EXAM: CT HEAD WITHOUT CONTRAST  TECHNIQUE: Contiguous axial images were obtained from the base of the skull through the vertex without intravenous contrast.  COMPARISON:  10/21/2013.  FINDINGS: Visualized paranasal sinuses and mastoids are clear. No acute osseous abnormality identified. No acute orbit or scalp soft tissue findings.  Calcified atherosclerosis at the skull base. Mildly decreased volume of subarachnoid hemorrhage, right sylvian fissure residual. No intraventricular hemorrhage identified today knee. Stable ventricle size and configuration. No midline shift, mass effect, or evidence of intracranial mass lesion. Stable gray-white matter differentiation throughout the brain. No evidence of cortically based acute  infarction identified.  IMPRESSION: Slightly decreased volume of subarachnoid hemorrhage. No ventriculomegaly or new intracranial abnormality identified.   Electronically Signed   By: Lars Pinks M.D.   On: 10/22/2013 23:41   Ct Head Wo Contrast  10/21/2013   CLINICAL DATA:  73 year old female with hyperglycemia and altered mental status. History of Alzheimer's.  EXAM: CT HEAD WITHOUT CONTRAST  TECHNIQUE: Contiguous axial images were obtained from the base of the skull through the vertex without intravenous contrast.  COMPARISON:  CT  01/09/2009, 12/23/2008  FINDINGS: Unremarkable appearance of the calvarium without acute fracture or aggressive lesion. No scalp swelling.  No significant paranasal sinus disease. Mastoid air cells are clear.  Unremarkable appearance of the orbits with bilateral lens extraction.  Multi compartmental acute hemorrhage, with the predominant focus within the right sylvian fissure and right frontal sulci. There also appears to be a small amount of hemorrhage in the right petroclival region and posterior to the right sella turcica. Small focus of subarachnoid hemorrhage in the sulci of the left temporal lobe. There also appears to be a small amount of intraparenchymal hemorrhage versus subarachnoid hemorrhage of the right cerebellar hemisphere.  Hemorrhage layered within the bilateral posterior horns of the lateral ventricles.  Progression of brain volume loss with expansion of the ventricles. Periventricular hypodensity. Intracranial atherosclerosis.  No midline shift.  IMPRESSION: Multi compartmental acute intracranial hemorrhage, with a largest focus in the right sylvian fissure extending superiorly along the sulci and into the right frontal sulci.  Additional foci of subarachnoid hemorrhage include the left temporal sulci, left sylvian fissure, and along the right petroclival ligament. Focus of hemorrhage within the right cerebellar hemisphere may be subarachnoid or intraparenchymal,  and there is also blood products layered within the posterior horn the lateral ventricles.  Progression of senescent brain volume loss, periventricular white matter and associated intracranial atherosclerosis.  These results were called by telephone at the time of interpretation on 10/21/2013 at 1:34 pm to Dr. Domenic Moras , who verbally acknowledged these results.  Signed,  Dulcy Fanny. Earleen Newport, DO  [facility]  [facility]   Electronically Signed   By: Corrie Mckusick D.O.   On: 10/21/2013 13:37    Microbiology: Recent Results (from the past 240 hour(s))  URINE CULTURE     Status: None   Collection Time    10/21/13 11:35 AM      Result Value Ref Range Status   Specimen Description URINE, RANDOM   Final   Special Requests ADDED [id number]   Final   Culture  Setup Time     Final   Value: 10/21/2013 17:42     Performed at [facility]     Final   Value: NO GROWTH     Performed at [facility]   Culture     Final   Value: NO GROWTH     Performed at [facility]   Report Status 10/22/2013 FINAL   Final  CULTURE, BLOOD (ROUTINE X 2)     Status: None   Collection Time    10/21/13 12:00 PM      Result Value Ref Range Status   Specimen Description BLOOD RIGHT ANTECUBITAL   Final   Special Requests BOTTLES DRAWN AEROBIC AND ANAEROBIC 5CC   Final   Culture  Setup Time     Final   Value: 10/21/2013 17:33     Performed at [facility]   Culture     Final   Value: NO GROWTH 5 DAYS     Performed at [facility]   Report Status 10/27/2013 FINAL   Final  CULTURE, BLOOD (ROUTINE X 2)     Status: None   Collection Time    10/21/13 12:08 PM      Result Value Ref Range Status   Specimen Description BLOOD HAND RIGHT   Final   Special Requests BOTTLES DRAWN AEROBIC ONLY 5CC   Final   Culture  Setup  Time     Final   Value: 10/21/2013 17:33     Performed at [facility]   Culture     Final    Value: NO GROWTH 5 DAYS     Performed at [facility]   Report Status 10/27/2013 FINAL   Final  MRSA PCR SCREENING     Status: None   Collection Time    10/22/13 10:12 AM      Result Value Ref Range Status   MRSA by PCR NEGATIVE  NEGATIVE Final   Comment:            The GeneXpert MRSA Assay (FDA     approved for NASAL specimens     only), is one component of a     comprehensive MRSA colonization     surveillance program. It is not     intended to diagnose MRSA     infection nor to guide or     monitor treatment for     MRSA infections.  CULTURE, BLOOD (ROUTINE X 2)     Status: None   Collection Time    10/25/13 11:35 AM      Result Value Ref Range Status   Specimen Description BLOOD RIGHT HAND   Final   Special Requests BOTTLES DRAWN AEROBIC AND ANAEROBIC 10CC EACH   Final   Culture  Setup Time     Final   Value: 10/25/2013 18:04     Performed at [facility]   Culture     Final   Value:        BLOOD CULTURE RECEIVED NO GROWTH TO DATE CULTURE WILL BE HELD FOR 5 DAYS BEFORE ISSUING A FINAL NEGATIVE REPORT     Performed at [facility]   Report Status PENDING   Incomplete  CULTURE, BLOOD (ROUTINE X 2)     Status: None   Collection Time    10/25/13  1:40 PM      Result Value Ref Range Status   Specimen Description BLOOD RIGHT HAND   Final   Special Requests BOTTLES DRAWN AEROBIC ONLY 3CC   Final   Culture  Setup Time     Final   Value: 10/25/2013 18:13     Performed at [facility]   Culture     Final   Value:        BLOOD CULTURE RECEIVED NO GROWTH TO DATE CULTURE WILL BE HELD FOR 5 DAYS BEFORE ISSUING A FINAL NEGATIVE REPORT     Performed at [facility]   Report Status PENDING   Incomplete     Labs: Basic Metabolic Panel:  Recent Labs Lab 10/23/13 0300 10/24/13 0314 10/25/13 0524 10/26/13 0515 10/27/13 0548 10/28/13 0506 10/29/13 0638  NA 146 142 134* 134* 138 138 139  K 3.4* 2.8* 3.3* 4.3 3.3* 3.3* 2.9*  CL 111 103  96 99 103 99 102  CO2 23 27 28 23 23 23 25   GLUCOSE 349* 235* 210* 193* 101* 97 224*  BUN 22 15 12 13 10 9 10   CREATININE 0.63 0.47* 0.49* 0.45* 0.42* 0.42* 0.50  CALCIUM 7.4* 7.8* 8.0* 7.9* 7.4* 8.0* 7.7*  MG  --  2.2  --   --   --   --   --    Liver Function Tests:  Recent Labs Lab 10/23/13 0300 10/24/13 0314  AST 107* 107*  ALT 90* 86*  ALKPHOS 59 69  BILITOT 0.3 1.1  PROT 5.5* 6.1  ALBUMIN 2.1* 2.4*   No results found for this basename: LIPASE, AMYLASE,  in the last 168 hours No results found for this basename: AMMONIA,  in the last 168 hours CBC:  Recent Labs Lab 10/23/13 0300 10/24/13 0314  10/25/13 1142 10/26/13 0515 10/27/13 0548 10/27/13 1632 10/28/13 0506 10/29/13 0638  WBC 0.7* 1.0*  < > 0.9* 0.8* 0.7* 0.9* 1.3* 0.9*  NEUTROABS 0.5* 0.6*  --  0.6*  --   --   --   --   --   HGB 6.7* 10.1*  < > 11.5* 11.3* 10.6* 9.4* 9.4* 8.8*  HCT 20.1* 29.3*  < > 32.9* 33.2* 32.2* 27.9* 28.0* 26.0*  MCV 111.0* 93.9  < > 92.4 94.6 97.3 95.2 96.9 94.9  PLT 110* 144*  < > 96* 50* 25* 94* 80* 39*  < > = values in this interval not displayed. Cardiac Enzymes: No results found for this basename: CKTOTAL, CKMB, CKMBINDEX, TROPONINI,  in the last 168 hours BNP: BNP (last 3 results) No results found for this basename: PROBNP,  in the last 8760 hours CBG:  Recent Labs Lab 10/28/13 0737 10/28/13 1145 10/28/13 1625 10/28/13 1954 10/29/13 0404  GLUCAP 100* 132* 169* 221* 211*       Signed:  Larren Copes  [facility] 10/29/2013, 11:07 AM

## 2013-10-29 NOTE — Clinical Social Work Note (Signed)
CSW met pt's husband Mr. Viar at bedside. CSW introduce self and purpose of visit. CSW informed the Mr. Scarberry that the pt will be discharge home today. CSW and Mr. Pacini discussed ambulance transport. CSW contact [facility] at [phone] to schedule transport for the pt. CSW informed Mr. Crammer and Bedside RN that transport was schedule. There are no additional needs identified.  CSW will sign off.   [place]aver, MSW, [facility]

## 2013-10-31 LAB — CULTURE, BLOOD (ROUTINE X 2)
Culture: NO GROWTH
Culture: NO GROWTH

## 2013-11-02 DEATH — deceased
